# Patient Record
Sex: Male | Born: 1985 | Race: White | Hispanic: No | Marital: Married | Smoking: Former smoker
Health system: Southern US, Community
[De-identification: ages and names within clinical notes are randomized; demographics above are authoritative.]

## PROBLEM LIST (undated history)

## (undated) DIAGNOSIS — S62109A Fracture of unspecified carpal bone, unspecified wrist, initial encounter for closed fracture: Secondary | ICD-10-CM

## (undated) DIAGNOSIS — T7840XA Allergy, unspecified, initial encounter: Secondary | ICD-10-CM

## (undated) DIAGNOSIS — S42009A Fracture of unspecified part of unspecified clavicle, initial encounter for closed fracture: Secondary | ICD-10-CM

## (undated) DIAGNOSIS — K219 Gastro-esophageal reflux disease without esophagitis: Secondary | ICD-10-CM

## (undated) HISTORY — PX: TONSILLECTOMY: SUR1361

## (undated) HISTORY — DX: Allergy, unspecified, initial encounter: T78.40XA

## (undated) HISTORY — PX: FRACTURE SURGERY: SHX138

---
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
## 2008-05-18 ENCOUNTER — Emergency Department: Payer: Self-pay | Admitting: Emergency Medicine

## 2010-03-08 ENCOUNTER — Emergency Department (HOSPITAL_COMMUNITY): Admission: EM | Admit: 2010-03-08 | Discharge: 2010-03-08 | Payer: Self-pay | Admitting: Emergency Medicine

## 2012-10-16 ENCOUNTER — Emergency Department: Payer: Self-pay | Admitting: Internal Medicine

## 2013-07-29 ENCOUNTER — Emergency Department (HOSPITAL_COMMUNITY)
Admission: EM | Admit: 2013-07-29 | Discharge: 2013-07-30 | Disposition: A | Discharge status: Home / Self Care | Payer: 59 | Attending: Emergency Medicine | Admitting: Emergency Medicine

## 2013-07-29 ENCOUNTER — Encounter (HOSPITAL_COMMUNITY): Payer: Self-pay | Admitting: Emergency Medicine

## 2013-07-29 ENCOUNTER — Emergency Department (HOSPITAL_COMMUNITY): Payer: 59

## 2013-07-29 DIAGNOSIS — S0083XA Contusion of other part of head, initial encounter: Secondary | ICD-10-CM

## 2013-07-29 DIAGNOSIS — S0003XA Contusion of scalp, initial encounter: Secondary | ICD-10-CM | POA: Insufficient documentation

## 2013-07-29 DIAGNOSIS — Z880 Allergy status to penicillin: Secondary | ICD-10-CM | POA: Insufficient documentation

## 2013-07-29 DIAGNOSIS — S1093XA Contusion of unspecified part of neck, initial encounter: Secondary | ICD-10-CM

## 2013-07-29 DIAGNOSIS — F17200 Nicotine dependence, unspecified, uncomplicated: Secondary | ICD-10-CM | POA: Insufficient documentation

## 2013-07-29 DIAGNOSIS — S42009A Fracture of unspecified part of unspecified clavicle, initial encounter for closed fracture: Secondary | ICD-10-CM | POA: Insufficient documentation

## 2013-07-29 DIAGNOSIS — IMO0002 Reserved for concepts with insufficient information to code with codable children: Secondary | ICD-10-CM | POA: Insufficient documentation

## 2013-07-29 DIAGNOSIS — S42001A Fracture of unspecified part of right clavicle, initial encounter for closed fracture: Secondary | ICD-10-CM

## 2013-07-29 DIAGNOSIS — T07XXXA Unspecified multiple injuries, initial encounter: Secondary | ICD-10-CM

## 2013-07-29 DIAGNOSIS — Y9389 Activity, other specified: Secondary | ICD-10-CM | POA: Insufficient documentation

## 2013-07-29 MED ORDER — OXYCODONE-ACETAMINOPHEN 5-325 MG PO TABS
1.0000 | ORAL_TABLET | Freq: Once | ORAL | Status: AC
  Administered 2013-07-29: 1 via ORAL
  Filled 2013-07-29: qty 1
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
## 2013-07-29 NOTE — ED Provider Notes (Addendum)
MSE was initiated and I personally evaluated the patient and placed orders (if any) at  10:35 PM on July 29, 2013.  The patient appears stable so that the remainder of the MSE may be completed by another provider.  Pt awake/alert, GCS 15.  Reports pain in right shoulder/clavicle.  He is able to move fingers on right hand and distal pulses are equal/intact.  Denies abd pain Imaging ordered   Joya Gaskinsonald W Welborn Keena, MD 07/29/13 2235  Joya Gaskinsonald W Rebbecca Osuna, MD 07/29/13 2236

## 2013-07-30 MED ORDER — OXYCODONE-ACETAMINOPHEN 5-325 MG PO TABS: 2.0000 | ORAL_TABLET | ORAL | Status: DC | PRN

## 2013-07-30 MED ORDER — IBUPROFEN 800 MG PO TABS: 800.0000 mg | ORAL_TABLET | Freq: Three times a day (TID) | ORAL | Status: DC

## 2013-07-30 NOTE — Discharge Instructions (Signed)
Clavicle Fracture °A clavicle fracture is a break in the collarbone. This is a common injury, especially in children. Collarbones do not harden until around the age of 20. Most collarbone fractures are treated with a simple arm sling. In some cases a figure-of-eight splint is used to help hold the broken bones in position. Although not often needed, surgery may be required if the bone fragments are not in the correct position (displaced).  °HOME CARE INSTRUCTIONS  °· Apply ice to the injury for 15-20 minutes each hour while awake for 2 days. Put the ice in a plastic bag and place a towel between the bag of ice and your skin. °· Wear the sling or splint constantly for as long as directed by your caregiver. You may remove the sling or splint for bathing or showering. Be sure to keep your shoulder in the same place as when the sling or splint is on. Do not lift your arm. °· If a figure-of-eight splint is applied, it must be tightened by another person every day. Tighten it enough to keep the shoulders held back. Allow enough room to place the index finger between the body and strap. Loosen the splint immediately if you feel numbness or tingling in your hands. °· Only take over-the-counter or prescription medicines for pain, discomfort, or fever as directed by your caregiver. °· Avoid activities that irritate or increase the pain for 4 to 6 weeks after surgery. °· Follow all instructions for follow-up with your caregiver. This includes any referrals, physical therapy, and rehabilitation. Any delay in obtaining necessary care could result in a delay or failure of the injury to heal properly. °SEEK MEDICAL CARE IF:  °You have pain and swelling that are not relieved with medications. °SEEK IMMEDIATE MEDICAL CARE IF:  °Your arm is numb, cold, or pale, even when the splint is loose. °MAKE SURE YOU:  °· Understand these instructions. °· Will watch your condition. °· Will get help right away if you are not doing well or get  worse. °Document Released: 01/21/2005 Document Revised: 07/06/2011 Document Reviewed: 11/17/2007 °[employer]® Patient Information ©2014 [employer]. ° °

## 2013-07-31 ENCOUNTER — Telehealth: Payer: Self-pay | Admitting: Orthopedic Surgery
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
## 2015-06-09 ENCOUNTER — Emergency Department (HOSPITAL_COMMUNITY): Payer: Self-pay

## 2015-06-09 ENCOUNTER — Emergency Department (HOSPITAL_COMMUNITY)
Admission: EM | Admit: 2015-06-09 | Discharge: 2015-06-09 | Disposition: A | Discharge status: Home / Self Care | Payer: Self-pay | Attending: Emergency Medicine | Admitting: Emergency Medicine

## 2015-06-09 ENCOUNTER — Encounter (HOSPITAL_COMMUNITY): Payer: Self-pay | Admitting: Emergency Medicine

## 2015-06-09 DIAGNOSIS — Y998 Other external cause status: Secondary | ICD-10-CM | POA: Insufficient documentation

## 2015-06-09 DIAGNOSIS — F17200 Nicotine dependence, unspecified, uncomplicated: Secondary | ICD-10-CM | POA: Insufficient documentation

## 2015-06-09 DIAGNOSIS — Y92410 Unspecified street and highway as the place of occurrence of the external cause: Secondary | ICD-10-CM | POA: Insufficient documentation

## 2015-06-09 DIAGNOSIS — Y9355 Activity, bike riding: Secondary | ICD-10-CM | POA: Insufficient documentation

## 2015-06-09 DIAGNOSIS — Z8781 Personal history of (healed) traumatic fracture: Secondary | ICD-10-CM | POA: Insufficient documentation

## 2015-06-09 DIAGNOSIS — S0081XA Abrasion of other part of head, initial encounter: Secondary | ICD-10-CM | POA: Insufficient documentation

## 2015-06-09 DIAGNOSIS — S060X1A Concussion with loss of consciousness of 30 minutes or less, initial encounter: Secondary | ICD-10-CM | POA: Insufficient documentation

## 2015-06-09 DIAGNOSIS — Z880 Allergy status to penicillin: Secondary | ICD-10-CM | POA: Insufficient documentation

## 2015-06-09 HISTORY — DX: Fracture of unspecified part of unspecified clavicle, initial encounter for closed fracture: S42.009A

## 2015-06-09 HISTORY — DX: Fracture of unspecified carpal bone, unspecified wrist, initial encounter for closed fracture: S62.109A

## 2015-06-09 LAB — CBC WITH DIFFERENTIAL/PLATELET
Basophils Absolute: 0 10*3/uL (ref 0.0–0.1)
Basophils Relative: 0 %
EOS ABS: 0 10*3/uL (ref 0.0–0.7)
Eosinophils Relative: 1 %
HCT: 46.6 % (ref 39.0–52.0)
HEMOGLOBIN: 16.3 g/dL (ref 13.0–17.0)
LYMPHS ABS: 1.6 10*3/uL (ref 0.7–4.0)
LYMPHS PCT: 28 %
MCH: 30.9 pg (ref 26.0–34.0)
MCHC: 35 g/dL (ref 30.0–36.0)
MCV: 88.4 fL (ref 78.0–100.0)
Monocytes Absolute: 0.5 10*3/uL (ref 0.1–1.0)
Monocytes Relative: 8 %
NEUTROS PCT: 63 %
Neutro Abs: 3.6 10*3/uL (ref 1.7–7.7)
Platelets: 204 10*3/uL (ref 150–400)
RBC: 5.27 MIL/uL (ref 4.22–5.81)
RDW: 12.7 % (ref 11.5–15.5)
WBC: 5.7 10*3/uL (ref 4.0–10.5)

## 2015-06-09 LAB — I-STAT CHEM 8, ED
BUN: 12 mg/dL (ref 6–20)
CALCIUM ION: 1.16 mmol/L (ref 1.12–1.23)
CHLORIDE: 100 mmol/L — AB (ref 101–111)
Creatinine, Ser: 1.2 mg/dL (ref 0.61–1.24)
Glucose, Bld: 106 mg/dL — ABNORMAL HIGH (ref 65–99)
HCT: 52 % (ref 39.0–52.0)
Hemoglobin: 17.7 g/dL — ABNORMAL HIGH (ref 13.0–17.0)
POTASSIUM: 3.4 mmol/L — AB (ref 3.5–5.1)
SODIUM: 139 mmol/L (ref 135–145)
TCO2: 23 mmol/L (ref 0–100)

## 2015-06-09 LAB — COMPREHENSIVE METABOLIC PANEL
ALBUMIN: 4.1 g/dL (ref 3.5–5.0)
ALK PHOS: 56 U/L (ref 38–126)
ALT: 23 U/L (ref 17–63)
AST: 25 U/L (ref 15–41)
Anion gap: 13 (ref 5–15)
BUN: 11 mg/dL (ref 6–20)
CALCIUM: 9.6 mg/dL (ref 8.9–10.3)
CO2: 22 mmol/L (ref 22–32)
CREATININE: 1.21 mg/dL (ref 0.61–1.24)
Chloride: 101 mmol/L (ref 101–111)
GFR calc non Af Amer: >60 mL/min (ref >60)
GLUCOSE: 108 mg/dL — AB (ref 65–99)
Potassium: 3.5 mmol/L (ref 3.5–5.1)
SODIUM: 136 mmol/L (ref 135–145)
Total Bilirubin: 1 mg/dL (ref 0.3–1.2)
Total Protein: 7 g/dL (ref 6.5–8.1)
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
## 2015-06-09 NOTE — Discharge Instructions (Signed)
Concussion, Adult °A concussion, or closed-head injury, is a brain injury caused by a direct blow to the head or by a quick and sudden movement (jolt) of the head or neck. Concussions are usually not life-threatening. Even so, the effects of a concussion can be serious. If you have had a concussion before, you are more likely to experience concussion-like symptoms after a direct blow to the head.  °CAUSES °· Direct blow to the head, such as from running into another player during a soccer game, being hit in a fight, or hitting your head on a hard surface. °· A jolt of the head or neck that causes the brain to move back and forth inside the skull, such as in a car crash. °SIGNS AND SYMPTOMS °The signs of a concussion can be hard to notice. Early on, they may be missed by you, family members, and health care providers. You may look fine but act or feel differently. °Symptoms are usually temporary, but they may last for days, weeks, or even longer. Some symptoms may appear right away while others may not show up for hours or days. Every head injury is different. Symptoms include: °· Mild to moderate headaches that will not go away. °· A feeling of pressure inside your head. °· Having more trouble than usual: °· Learning or remembering things you have heard. °· Answering questions. °· Paying attention or concentrating. °· Organizing daily tasks. °· Making decisions and solving problems. °· Slowness in thinking, acting or reacting, speaking, or reading. °· Getting lost or being easily confused. °· Feeling tired all the time or lacking energy (fatigued). °· Feeling drowsy. °· Sleep disturbances. °· Sleeping more than usual. °· Sleeping less than usual. °· Trouble falling asleep. °· Trouble sleeping (insomnia). °· Loss of balance or feeling lightheaded or dizzy. °· Nausea or vomiting. °· Numbness or tingling. °· Increased sensitivity to: °· Sounds. °· Lights. °· Distractions. °· Vision problems or eyes that tire  easily. °· Diminished sense of taste or smell. °· Ringing in the ears. °· Mood changes such as feeling sad or anxious. °· Becoming easily irritated or angry for little or no reason. °· Lack of motivation. °· Seeing or hearing things other people do not see or hear (hallucinations). °DIAGNOSIS °Your health care provider can usually diagnose a concussion based on a description of your injury and symptoms. He or she will ask whether you passed out (lost consciousness) and whether you are having trouble remembering events that happened right before and during your injury. °Your evaluation might include: °· A brain scan to look for signs of injury to the brain. Even if the test shows no injury, you may still have a concussion. °· Blood tests to be sure other problems are not present. °TREATMENT °· Concussions are usually treated in an emergency department, in urgent care, or at a clinic. You may need to stay in the hospital overnight for further treatment. °· Tell your health care provider if you are taking any medicines, including prescription medicines, over-the-counter medicines, and natural remedies. Some medicines, such as blood thinners (anticoagulants) and aspirin, may increase the chance of complications. Also tell your health care provider whether you have had alcohol or are taking illegal drugs. This information may affect treatment. °· Your health care provider will send you home with important instructions to follow. °· How fast you will recover from a concussion depends on many factors. These factors include how severe your concussion is, what part of your brain was injured,   your age, and how healthy you were before the concussion. °· Most people with mild injuries recover fully. Recovery can take time. In general, recovery is slower in older persons. Also, persons who have had a concussion in the past or have other medical problems may find that it takes longer to recover from their current injury. °HOME  CARE INSTRUCTIONS °General Instructions °· Carefully follow the directions your health care provider gave you. °· Only take over-the-counter or prescription medicines for pain, discomfort, or fever as directed by your health care provider. °· Take only those medicines that your health care provider has approved. °· Do not drink alcohol until your health care provider says you are well enough to do so. Alcohol and certain other drugs may slow your recovery and can put you at risk of further injury. °· If it is harder than usual to remember things, write them down. °· If you are easily distracted, try to do one thing at a time. For example, do not try to watch TV while fixing dinner. °· Talk with family members or close friends when making important decisions. °· Keep all follow-up appointments. Repeated evaluation of your symptoms is recommended for your recovery. °· Watch your symptoms and tell others to do the same. Complications sometimes occur after a concussion. Older adults with a brain injury may have a higher risk of serious complications, such as a blood clot on the brain. °· Tell your teachers, school nurse, school counselor, coach, athletic trainer, or work manager about your injury, symptoms, and restrictions. Tell them about what you can or cannot do. They should watch for: °¨ Increased problems with attention or concentration. °¨ Increased difficulty remembering or learning new information. °¨ Increased time needed to complete tasks or assignments. °¨ Increased irritability or decreased ability to cope with stress. °¨ Increased symptoms. °· Rest. Rest helps the brain to heal. Make sure you: °¨ Get plenty of sleep at night. Avoid staying up late at night. °¨ Keep the same bedtime hours on weekends and weekdays. °¨ Rest during the day. Take daytime naps or rest breaks when you feel tired. °· Limit activities that require a lot of thought or concentration. These include: °¨ Doing homework or job-related  work. °¨ Watching TV. °¨ Working on the computer. °· Avoid any situation where there is potential for another head injury (football, hockey, soccer, basketball, martial arts, downhill snow sports and horseback riding). Your condition will get worse every time you experience a concussion. You should avoid these activities until you are evaluated by the appropriate follow-up health care providers. °Returning To Your Regular Activities °You will need to return to your normal activities slowly, not all at once. You must give your body and brain enough time for recovery. °· Do not return to sports or other athletic activities until your health care provider tells you it is safe to do so. °· Ask your health care provider when you can drive, ride a bicycle, or operate heavy machinery. Your ability to react may be slower after a brain injury. Never do these activities if you are dizzy. °· Ask your health care provider about when you can return to work or school. °Preventing Another Concussion °It is very important to avoid another brain injury, especially before you have recovered. In rare cases, another injury can lead to permanent brain damage, brain swelling, or death. The risk of this is greatest during the first 7-10 days after a head injury. Avoid injuries by: °· Wearing a   seat belt when riding in a car. °· Drinking alcohol only in moderation. °· Wearing a helmet when biking, skiing, skateboarding, skating, or doing similar activities. °· Avoiding activities that could lead to a second concussion, such as contact or recreational sports, until your health care provider says it is okay. °· Taking safety measures in your home. °¨ Remove clutter and tripping hazards from floors and stairways. °¨ Use grab bars in bathrooms and handrails by stairs. °¨ Place non-slip mats on floors and in bathtubs. °¨ Improve lighting in dim areas. °SEEK MEDICAL CARE IF: °· You have increased problems paying attention or  concentrating. °· You have increased difficulty remembering or learning new information. °· You need more time to complete tasks or assignments than before. °· You have increased irritability or decreased ability to cope with stress. °· You have more symptoms than before. °Seek medical care if you have any of the following symptoms for more than 2 weeks after your injury: °· Lasting (chronic) headaches. °· Dizziness or balance problems. °· Nausea. °· Vision problems. °· Increased sensitivity to noise or light. °· Depression or mood swings. °· Anxiety or irritability. °· Memory problems. °· Difficulty concentrating or paying attention. °· Sleep problems. °· Feeling tired all the time. °SEEK IMMEDIATE MEDICAL CARE IF: °· You have severe or worsening headaches. These may be a sign of a blood clot in the brain. °· You have weakness (even if only in one hand, leg, or part of the face). °· You have numbness. °· You have decreased coordination. °· You vomit repeatedly. °· You have increased sleepiness. °· One pupil is larger than the other. °· You have convulsions. °· You have slurred speech. °· You have increased confusion. This may be a sign of a blood clot in the brain. °· You have increased restlessness, agitation, or irritability. °· You are unable to recognize people or places. °· You have neck pain. °· It is difficult to wake you up. °· You have unusual behavior changes. °· You lose consciousness. °MAKE SURE YOU: °· Understand these instructions. °· Will watch your condition. °· Will get help right away if you are not doing well or get worse. °  °This information is not intended to replace advice given to you by your health care provider. Make sure you discuss any questions you have with your health care provider. °  °Document Released: 07/04/2003 Document Revised: 05/04/2014 Document Reviewed: 11/03/2012 °Elsevier Interactive Patient Education ©2016 [employer] ° °Motor Vehicle Collision °It is common to have  multiple bruises and sore muscles after a motor vehicle collision (MVC). These tend to feel worse for the first 24 hours. You may have the most stiffness and soreness over the first several hours. You may also feel worse when you wake up the first morning after your collision. After this point, you will usually begin to improve with each day. The speed of improvement often depends on the severity of the collision, the number of injuries, and the location and nature of these injuries. °HOME CARE INSTRUCTIONS °· Put ice on the injured area. °¨ Put ice in a plastic bag. °¨ Place a towel between your skin and the bag. °¨ Leave the ice on for 15-20 minutes, 3-4 times a day, or as directed by your health care provider. °· Drink enough fluids to keep your urine clear or pale yellow. Do not drink alcohol. °· Take a warm shower or bath once or twice a day. This will increase blood flow to sore   muscles. °· You may return to activities as directed by your caregiver. Be careful when lifting, as this may aggravate neck or back pain. °· Only take over-the-counter or prescription medicines for pain, discomfort, or fever as directed by your caregiver. Do not use aspirin. This may increase bruising and bleeding. °SEEK IMMEDIATE MEDICAL CARE IF: °· You have numbness, tingling, or weakness in the arms or legs. °· You develop severe headaches not relieved with medicine. °· You have severe neck pain, especially tenderness in the middle of the back of your neck. °· You have changes in bowel or bladder control. °· There is increasing pain in any area of the body. °· You have shortness of breath, light-headedness, dizziness, or fainting. °· You have chest pain. °· You feel sick to your stomach (nauseous), throw up (vomit), or sweat. °· You have increasing abdominal discomfort. °· There is blood in your urine, stool, or vomit. °· You have pain in your shoulder (shoulder strap areas). °· You feel your symptoms are getting worse. °MAKE SURE  YOU: °· Understand these instructions. °· Will watch your condition. °· Will get help right away if you are not doing well or get worse. °  °This information is not intended to replace advice given to you by your health care provider. Make sure you discuss any questions you have with your health care provider. °  °Document Released: 04/13/2005 Document Revised: 05/04/2014 Document Reviewed: 09/10/2010 °Elsevier Interactive Patient Education ©2016 [employer] ° °

## 2015-06-09 NOTE — ED Notes (Signed)
Pt stable, ambulatory, states understanding of discharge instructions 

## 2015-06-09 NOTE — ED Notes (Signed)
See trauma note

## 2017-08-08 ENCOUNTER — Emergency Department: Payer: No Typology Code available for payment source

## 2017-08-08 ENCOUNTER — Encounter: Payer: Self-pay | Admitting: Emergency Medicine

## 2017-08-08 ENCOUNTER — Emergency Department
Admission: EM | Admit: 2017-08-08 | Discharge: 2017-08-08 | Disposition: A | Discharge status: Home / Self Care | Payer: No Typology Code available for payment source | Attending: Emergency Medicine | Admitting: Emergency Medicine

## 2017-08-08 DIAGNOSIS — Y9389 Activity, other specified: Secondary | ICD-10-CM | POA: Diagnosis not present

## 2017-08-08 DIAGNOSIS — Y998 Other external cause status: Secondary | ICD-10-CM | POA: Diagnosis not present

## 2017-08-08 DIAGNOSIS — Y92410 Unspecified street and highway as the place of occurrence of the external cause: Secondary | ICD-10-CM | POA: Diagnosis not present

## 2017-08-08 DIAGNOSIS — S63502A Unspecified sprain of left wrist, initial encounter: Secondary | ICD-10-CM | POA: Insufficient documentation

## 2017-08-08 DIAGNOSIS — Z87891 Personal history of nicotine dependence: Secondary | ICD-10-CM | POA: Diagnosis not present

## 2017-08-08 DIAGNOSIS — S6992XA Unspecified injury of left wrist, hand and finger(s), initial encounter: Secondary | ICD-10-CM | POA: Diagnosis present

## 2017-08-08 MED ORDER — MELOXICAM 15 MG PO TABS: 15.0000 mg | ORAL_TABLET | Freq: Every day | ORAL | 0 refills | Status: DC

## 2017-08-08 MED ORDER — TRAMADOL HCL 50 MG PO TABS: 50.0000 mg | ORAL_TABLET | Freq: Four times a day (QID) | ORAL | 0 refills | Status: DC | PRN
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
## 2017-08-08 NOTE — ED Notes (Signed)

## 2019-10-25 ENCOUNTER — Ambulatory Visit: Payer: Self-pay

## 2019-10-25 ENCOUNTER — Other Ambulatory Visit: Payer: Self-pay

## 2019-10-25 ENCOUNTER — Emergency Department
Admission: EM | Admit: 2019-10-25 | Discharge: 2019-10-25 | Disposition: A | Discharge status: Home / Self Care | Payer: BC Managed Care – PPO | Attending: Emergency Medicine | Admitting: Emergency Medicine

## 2019-10-25 ENCOUNTER — Encounter: Payer: Self-pay | Admitting: Emergency Medicine

## 2019-10-25 ENCOUNTER — Ambulatory Visit: Admission: RE | Admit: 2019-10-25 | Payer: No Typology Code available for payment source | Source: Ambulatory Visit

## 2019-10-25 DIAGNOSIS — R0602 Shortness of breath: Secondary | ICD-10-CM | POA: Diagnosis present

## 2019-10-25 DIAGNOSIS — Z5321 Procedure and treatment not carried out due to patient leaving prior to being seen by health care provider: Secondary | ICD-10-CM | POA: Diagnosis not present

## 2019-10-25 NOTE — ED Notes (Signed)
Pt to desk, got a call that his son is sick at daycare and needs to be picked up. Pt has no one else to pick him up and has to leave. Encouraged to return if can. Alert and oriented.

## 2019-10-25 NOTE — ED Triage Notes (Signed)
Pt here for [facility]. Went to [facility] and passed out when they were about to draw his blood.  Passes out when thinking about blood going into tubes. Would like to wait and not do blood work at this time. Started feeling bad Friday. No cough at this time, unless takes large breath.  Minimal low back pain.denies fevers.  Started with general body aches.  Tachypnea noted but unlabored at this time. Reports sats were 89 at home, currently 95% RA

## 2021-01-12 ENCOUNTER — Encounter (HOSPITAL_COMMUNITY): Payer: Self-pay | Admitting: Emergency Medicine

## 2021-01-12 ENCOUNTER — Emergency Department (HOSPITAL_COMMUNITY): Payer: BC Managed Care – PPO

## 2021-01-12 ENCOUNTER — Emergency Department (HOSPITAL_COMMUNITY)
Admission: EM | Admit: 2021-01-12 | Discharge: 2021-01-13 | Disposition: A | Discharge status: Home / Self Care | Payer: BC Managed Care – PPO | Attending: Emergency Medicine | Admitting: Emergency Medicine

## 2021-01-12 ENCOUNTER — Other Ambulatory Visit: Payer: Self-pay

## 2021-01-12 DIAGNOSIS — S81811A Laceration without foreign body, right lower leg, initial encounter: Secondary | ICD-10-CM | POA: Insufficient documentation

## 2021-01-12 DIAGNOSIS — Z87891 Personal history of nicotine dependence: Secondary | ICD-10-CM | POA: Diagnosis not present

## 2021-01-12 DIAGNOSIS — Y9389 Activity, other specified: Secondary | ICD-10-CM | POA: Diagnosis not present

## 2021-01-12 DIAGNOSIS — W230XXA Caught, crushed, jammed, or pinched between moving objects, initial encounter: Secondary | ICD-10-CM | POA: Diagnosis not present

## 2021-01-12 DIAGNOSIS — S8991XA Unspecified injury of right lower leg, initial encounter: Secondary | ICD-10-CM | POA: Diagnosis present

## 2021-01-12 MED ORDER — CEFAZOLIN SODIUM 1 G IJ SOLR
1.0000 g | Freq: Once | INTRAMUSCULAR | Status: AC
  Administered 2021-01-12: 1 g via INTRAMUSCULAR
  Filled 2021-01-12: qty 10

## 2021-01-12 MED ORDER — LIDOCAINE-EPINEPHRINE (PF) 2 %-1:200000 IJ SOLN
20.0000 mL | Freq: Once | INTRAMUSCULAR | Status: AC
  Administered 2021-01-12: 20 mL

## 2021-01-12 MED ORDER — HYDROCODONE-ACETAMINOPHEN 5-325 MG PO TABS
1.0000 | ORAL_TABLET | Freq: Once | ORAL | Status: AC
  Administered 2021-01-12: 1 via ORAL
  Filled 2021-01-12: qty 1

## 2021-01-12 MED ORDER — POVIDONE-IODINE 5 % EX SOLN
Freq: Once | CUTANEOUS | Status: AC
  Filled 2021-01-12: qty 88.7
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
## 2021-01-12 NOTE — ED Notes (Signed)
Tetanus shot 3 years ago
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
## 2021-01-13 MED ORDER — CEPHALEXIN 500 MG PO CAPS: 500.0000 mg | ORAL_CAPSULE | Freq: Four times a day (QID) | ORAL | 0 refills | Status: DC

## 2021-01-13 NOTE — Discharge Instructions (Signed)
Keep wound clean and dry.  Follow-up with Dr. Eulah Pont.  As we discussed there could be some damage to the muscle and/or tendons or nerves.  Wear the boot and can use the crutches.  Take the antibiotics as prescribed.  Sutures should be removed in 7 to 10 days by Dr. Eulah Pont.  Return to the ED with new or worsening symptoms.

## 2021-09-03 ENCOUNTER — Other Ambulatory Visit: Payer: Self-pay

## 2021-09-03 ENCOUNTER — Emergency Department (HOSPITAL_COMMUNITY): Payer: BC Managed Care – PPO

## 2021-09-03 ENCOUNTER — Encounter (HOSPITAL_COMMUNITY): Payer: Self-pay | Admitting: Emergency Medicine

## 2021-09-03 ENCOUNTER — Inpatient Hospital Stay (HOSPITAL_COMMUNITY)
Admission: EM | Admit: 2021-09-03 | Discharge: 2021-09-05 | DRG: 493 | Disposition: A | Discharge status: Home / Self Care | Payer: BC Managed Care – PPO | Attending: Orthopedic Surgery | Admitting: Orthopedic Surgery

## 2021-09-03 DIAGNOSIS — S82401A Unspecified fracture of shaft of right fibula, initial encounter for closed fracture: Secondary | ICD-10-CM | POA: Diagnosis present

## 2021-09-03 DIAGNOSIS — D62 Acute posthemorrhagic anemia: Secondary | ICD-10-CM | POA: Diagnosis not present

## 2021-09-03 DIAGNOSIS — S82201A Unspecified fracture of shaft of right tibia, initial encounter for closed fracture: Secondary | ICD-10-CM | POA: Diagnosis not present

## 2021-09-03 DIAGNOSIS — Z20822 Contact with and (suspected) exposure to covid-19: Secondary | ICD-10-CM | POA: Diagnosis not present

## 2021-09-03 DIAGNOSIS — S82831A Other fracture of upper and lower end of right fibula, initial encounter for closed fracture: Secondary | ICD-10-CM | POA: Diagnosis not present

## 2021-09-03 DIAGNOSIS — M25561 Pain in right knee: Secondary | ICD-10-CM | POA: Diagnosis not present

## 2021-09-03 DIAGNOSIS — Z87891 Personal history of nicotine dependence: Secondary | ICD-10-CM | POA: Diagnosis not present

## 2021-09-03 DIAGNOSIS — S79921A Unspecified injury of right thigh, initial encounter: Secondary | ICD-10-CM | POA: Diagnosis not present

## 2021-09-03 DIAGNOSIS — Z22322 Carrier or suspected carrier of Methicillin resistant Staphylococcus aureus: Secondary | ICD-10-CM | POA: Diagnosis not present

## 2021-09-03 DIAGNOSIS — Z79899 Other long term (current) drug therapy: Secondary | ICD-10-CM | POA: Diagnosis not present

## 2021-09-03 DIAGNOSIS — Z881 Allergy status to other antibiotic agents status: Secondary | ICD-10-CM

## 2021-09-03 DIAGNOSIS — R52 Pain, unspecified: Secondary | ICD-10-CM | POA: Diagnosis not present

## 2021-09-03 DIAGNOSIS — S82461A Displaced segmental fracture of shaft of right fibula, initial encounter for closed fracture: Secondary | ICD-10-CM | POA: Diagnosis not present

## 2021-09-03 DIAGNOSIS — K219 Gastro-esophageal reflux disease without esophagitis: Secondary | ICD-10-CM | POA: Diagnosis present

## 2021-09-03 DIAGNOSIS — Z7901 Long term (current) use of anticoagulants: Secondary | ICD-10-CM

## 2021-09-03 DIAGNOSIS — S99921A Unspecified injury of right foot, initial encounter: Secondary | ICD-10-CM | POA: Diagnosis not present

## 2021-09-03 DIAGNOSIS — S82251A Displaced comminuted fracture of shaft of right tibia, initial encounter for closed fracture: Secondary | ICD-10-CM | POA: Diagnosis not present

## 2021-09-03 DIAGNOSIS — S82451A Displaced comminuted fracture of shaft of right fibula, initial encounter for closed fracture: Secondary | ICD-10-CM | POA: Diagnosis not present

## 2021-09-03 DIAGNOSIS — Z041 Encounter for examination and observation following transport accident: Secondary | ICD-10-CM | POA: Diagnosis not present

## 2021-09-03 DIAGNOSIS — S82871A Displaced pilon fracture of right tibia, initial encounter for closed fracture: Principal | ICD-10-CM | POA: Diagnosis present

## 2021-09-03 DIAGNOSIS — M25571 Pain in right ankle and joints of right foot: Secondary | ICD-10-CM | POA: Diagnosis not present

## 2021-09-03 DIAGNOSIS — I10 Essential (primary) hypertension: Secondary | ICD-10-CM | POA: Diagnosis not present

## 2021-09-03 DIAGNOSIS — Z9889 Other specified postprocedural states: Secondary | ICD-10-CM | POA: Diagnosis not present

## 2021-09-03 HISTORY — DX: Gastro-esophageal reflux disease without esophagitis: K21.9

## 2021-09-03 LAB — CBC
HCT: 44.1 % (ref 39.0–52.0)
Hemoglobin: 15.4 g/dL (ref 13.0–17.0)
MCH: 32.1 pg (ref 26.0–34.0)
MCHC: 34.9 g/dL (ref 30.0–36.0)
MCV: 91.9 fL (ref 80.0–100.0)
Platelets: 247 10*3/uL (ref 150–400)
RBC: 4.8 MIL/uL (ref 4.22–5.81)
RDW: 12.4 % (ref 11.5–15.5)
WBC: 10.4 10*3/uL (ref 4.0–10.5)
nRBC: 0 % (ref 0.0–0.2)

## 2021-09-03 LAB — COMPREHENSIVE METABOLIC PANEL
ALT: 43 U/L (ref 0–44)
AST: 28 U/L (ref 15–41)
Albumin: 3.9 g/dL (ref 3.5–5.0)
Alkaline Phosphatase: 57 U/L (ref 38–126)
Anion gap: 10 (ref 5–15)
BUN: 12 mg/dL (ref 6–20)
CO2: 23 mmol/L (ref 22–32)
Calcium: 9.4 mg/dL (ref 8.9–10.3)
Chloride: 104 mmol/L (ref 98–111)
Creatinine, Ser: 1.08 mg/dL (ref 0.61–1.24)
GFR, Estimated: >60 mL/min (ref >60)
Glucose, Bld: 107 mg/dL — ABNORMAL HIGH (ref 70–99)
Potassium: 3.5 mmol/L (ref 3.5–5.1)
Sodium: 137 mmol/L (ref 135–145)
Total Bilirubin: 0.7 mg/dL (ref 0.3–1.2)
Total Protein: 6.9 g/dL (ref 6.5–8.1)

## 2021-09-03 LAB — I-STAT VENOUS BLOOD GAS, ED
Acid-base deficit: 2 mmol/L (ref 0.0–2.0)
Bicarbonate: 22.9 mmol/L (ref 20.0–28.0)
Calcium, Ion: 1.12 mmol/L — ABNORMAL LOW (ref 1.15–1.40)
HCT: 45 % (ref 39.0–52.0)
Hemoglobin: 15.3 g/dL (ref 13.0–17.0)
O2 Saturation: 91 %
Potassium: 3.5 mmol/L (ref 3.5–5.1)
Sodium: 137 mmol/L (ref 135–145)
TCO2: 24 mmol/L (ref 22–32)
pCO2, Ven: 37.1 mmHg — ABNORMAL LOW (ref 44–60)
pH, Ven: 7.398 (ref 7.25–7.43)
pO2, Ven: 61 mmHg — ABNORMAL HIGH (ref 32–45)

## 2021-09-03 LAB — SAMPLE TO BLOOD BANK

## 2021-09-03 LAB — PROTIME-INR
INR: 0.9 (ref 0.8–1.2)
Prothrombin Time: 12.1 seconds (ref 11.4–15.2)

## 2021-09-03 LAB — LACTIC ACID, PLASMA: Lactic Acid, Venous: 2.8 mmol/L (ref 0.5–1.9)

## 2021-09-03 LAB — ETHANOL: Alcohol, Ethyl (B): <10 mg/dL (ref <10)

## 2021-09-03 MED ORDER — ACETAMINOPHEN 500 MG PO TABS
1000.0000 mg | ORAL_TABLET | Freq: Four times a day (QID) | ORAL | Status: DC
  Administered 2021-09-03 – 2021-09-04 (×2): 1000 mg via ORAL
  Filled 2021-09-03 (×2): qty 2

## 2021-09-03 MED ORDER — OXYCODONE HCL 5 MG PO TABS
10.0000 mg | ORAL_TABLET | ORAL | Status: DC | PRN
  Administered 2021-09-04: 10 mg via ORAL

## 2021-09-03 MED ORDER — METHOCARBAMOL 1000 MG/10ML IJ SOLN: 500.0000 mg | Freq: Four times a day (QID) | INTRAVENOUS | Status: DC | PRN

## 2021-09-03 MED ORDER — ENOXAPARIN SODIUM 40 MG/0.4ML IJ SOSY: 40.0000 mg | PREFILLED_SYRINGE | INTRAMUSCULAR | Status: DC

## 2021-09-03 MED ORDER — HYDROMORPHONE HCL 1 MG/ML IJ SOLN
INTRAMUSCULAR | Status: AC
  Administered 2021-09-03: 0.5 mg via INTRAVENOUS
  Filled 2021-09-03: qty 1

## 2021-09-03 MED ORDER — HYDROMORPHONE HCL 1 MG/ML IJ SOLN
1.0000 mg | Freq: Once | INTRAMUSCULAR | Status: AC
  Administered 2021-09-04: 1 mg via INTRAVENOUS

## 2021-09-03 MED ORDER — ONDANSETRON HCL 4 MG PO TABS: 4.0000 mg | ORAL_TABLET | Freq: Four times a day (QID) | ORAL | Status: DC | PRN

## 2021-09-03 MED ORDER — METHOCARBAMOL 500 MG PO TABS
500.0000 mg | ORAL_TABLET | Freq: Four times a day (QID) | ORAL | Status: DC | PRN
  Administered 2021-09-03: 500 mg via ORAL
  Filled 2021-09-03: qty 1

## 2021-09-03 MED ORDER — HYDROMORPHONE HCL 1 MG/ML IJ SOLN
0.5000 mg | INTRAMUSCULAR | Status: DC | PRN
  Administered 2021-09-04 (×3): 1 mg via INTRAVENOUS
  Filled 2021-09-03 (×3): qty 1

## 2021-09-03 MED ORDER — HYDROMORPHONE HCL 1 MG/ML IJ SOLN: 0.5000 mg | Freq: Once | INTRAMUSCULAR | Status: AC

## 2021-09-03 MED ORDER — ONDANSETRON HCL 4 MG/2ML IJ SOLN: 4.0000 mg | Freq: Four times a day (QID) | INTRAMUSCULAR | Status: DC | PRN

## 2021-09-03 MED ORDER — ACETAMINOPHEN 325 MG PO TABS: 325.0000 mg | ORAL_TABLET | Freq: Four times a day (QID) | ORAL | Status: DC | PRN

## 2021-09-03 MED ORDER — SODIUM CHLORIDE 0.9 % IV BOLUS
1000.0000 mL | Freq: Once | INTRAVENOUS | Status: AC
  Administered 2021-09-03: 1000 mL via INTRAVENOUS

## 2021-09-03 MED ORDER — OXYCODONE HCL 5 MG PO TABS
5.0000 mg | ORAL_TABLET | ORAL | Status: DC | PRN
  Administered 2021-09-03: 10 mg via ORAL
  Filled 2021-09-03 (×2): qty 2

## 2021-09-03 NOTE — Progress Notes (Signed)
Orthopedic Tech Progress Note ?Patient Details:  ?Calvin Simpson ?January 23, 1986 ?[id number] ? ?Patient ID: Calvin Simpson, male   DOB: 09/09/1985, 36 y.o.   MRN: [id number] ?Level II; not needed at the moment. ? ?[place] ?09/03/2021, 10:37 PM ? ?

## 2021-09-03 NOTE — Progress Notes (Signed)
Patient discussed with EDP and films reviewed.  Right distal 1/3 tibia and fibula shaft fractures.  Closed injury.  Per EDP report compartments soft and compressible, no clinical concern for compartment syndrome at this time.  Neurovascularly intact per EDP examination.   ? ?Recommendations: ?- AO splint (posterior slab + stirrup) for immobilization ?- Elevated higher than heart ?- Admit in anticipation of operative fixation, likely 09/04/2021 pending availability ?- Continue to monitor pain closely and neurovascular checks regularly ? ?Full note to follow. ? ?Georgeanna Harrison M.D. ?Orthopaedic Surgery ?[facility] ? ?
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
## 2021-09-03 NOTE — ED Notes (Signed)
Wife at bedside.
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
## 2021-09-03 NOTE — ED Notes (Signed)
Ortho tech called 
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
## 2021-09-04 ENCOUNTER — Other Ambulatory Visit: Payer: Self-pay

## 2021-09-04 ENCOUNTER — Encounter (HOSPITAL_COMMUNITY)
Admission: EM | Disposition: A | Discharge status: Home / Self Care | Payer: Self-pay | Source: Home / Self Care | Attending: Orthopedic Surgery

## 2021-09-04 ENCOUNTER — Inpatient Hospital Stay (HOSPITAL_COMMUNITY): Payer: BC Managed Care – PPO

## 2021-09-04 ENCOUNTER — Inpatient Hospital Stay (HOSPITAL_COMMUNITY): Payer: BC Managed Care – PPO | Admitting: Certified Registered Nurse Anesthetist

## 2021-09-04 ENCOUNTER — Encounter (HOSPITAL_COMMUNITY): Payer: Self-pay | Admitting: Orthopedic Surgery

## 2021-09-04 DIAGNOSIS — S82201A Unspecified fracture of shaft of right tibia, initial encounter for closed fracture: Secondary | ICD-10-CM | POA: Diagnosis present

## 2021-09-04 DIAGNOSIS — Z9889 Other specified postprocedural states: Secondary | ICD-10-CM | POA: Diagnosis not present

## 2021-09-04 DIAGNOSIS — S82451A Displaced comminuted fracture of shaft of right fibula, initial encounter for closed fracture: Secondary | ICD-10-CM | POA: Diagnosis not present

## 2021-09-04 HISTORY — PX: TIBIA IM NAIL INSERTION: SHX2516

## 2021-09-04 LAB — URINALYSIS, ROUTINE W REFLEX MICROSCOPIC
Bilirubin Urine: NEGATIVE
Glucose, UA: NEGATIVE mg/dL
Ketones, ur: NEGATIVE mg/dL
Leukocytes,Ua: NEGATIVE
Nitrite: NEGATIVE
Protein, ur: NEGATIVE mg/dL
Specific Gravity, Urine: 1.019 (ref 1.005–1.030)
pH: 7 (ref 5.0–8.0)

## 2021-09-04 LAB — SURGICAL PCR SCREEN
MRSA, PCR: NEGATIVE
Staphylococcus aureus: POSITIVE — AB

## 2021-09-04 LAB — RESP PANEL BY RT-PCR (FLU A&B, COVID) ARPGX2
Influenza A by PCR: NEGATIVE
Influenza B by PCR: NEGATIVE
SARS Coronavirus 2 by RT PCR: NEGATIVE

## 2021-09-04 LAB — HIV ANTIBODY (ROUTINE TESTING W REFLEX): HIV Screen 4th Generation wRfx: NONREACTIVE

## 2021-09-04 SURGERY — INSERTION, INTRAMEDULLARY ROD, TIBIA
Anesthesia: General | Laterality: Right

## 2021-09-04 MED ORDER — LIDOCAINE 2% (20 MG/ML) 5 ML SYRINGE
INTRAMUSCULAR | Status: AC
  Filled 2021-09-04: qty 5

## 2021-09-04 MED ORDER — CEFAZOLIN SODIUM-DEXTROSE 2-4 GM/100ML-% IV SOLN
INTRAVENOUS | Status: AC
  Filled 2021-09-04: qty 100

## 2021-09-04 MED ORDER — ONDANSETRON HCL 4 MG/2ML IJ SOLN: 4.0000 mg | Freq: Once | INTRAMUSCULAR | Status: DC | PRN

## 2021-09-04 MED ORDER — METHOCARBAMOL 1000 MG/10ML IJ SOLN
500.0000 mg | Freq: Three times a day (TID) | INTRAVENOUS | Status: DC
  Filled 2021-09-04: qty 5

## 2021-09-04 MED ORDER — DEXAMETHASONE SODIUM PHOSPHATE 10 MG/ML IJ SOLN
INTRAMUSCULAR | Status: DC | PRN
  Administered 2021-09-04: 10 mg via INTRAVENOUS

## 2021-09-04 MED ORDER — OXYCODONE HCL 5 MG PO TABS: 5.0000 mg | ORAL_TABLET | Freq: Once | ORAL | Status: DC | PRN

## 2021-09-04 MED ORDER — DOCUSATE SODIUM 100 MG PO CAPS
100.0000 mg | ORAL_CAPSULE | Freq: Two times a day (BID) | ORAL | Status: DC
  Administered 2021-09-04: 100 mg via ORAL
  Filled 2021-09-04: qty 1

## 2021-09-04 MED ORDER — HYDROMORPHONE HCL 1 MG/ML IJ SOLN
INTRAMUSCULAR | Status: AC
  Filled 2021-09-04: qty 1

## 2021-09-04 MED ORDER — 0.9 % SODIUM CHLORIDE (POUR BTL) OPTIME
TOPICAL | Status: DC | PRN
  Administered 2021-09-04: 1000 mL

## 2021-09-04 MED ORDER — ACETAMINOPHEN 10 MG/ML IV SOLN
INTRAVENOUS | Status: AC
  Filled 2021-09-04: qty 100

## 2021-09-04 MED ORDER — PANTOPRAZOLE SODIUM 40 MG PO TBEC
40.0000 mg | DELAYED_RELEASE_TABLET | Freq: Every day | ORAL | Status: DC
  Administered 2021-09-04: 40 mg via ORAL
  Filled 2021-09-04: qty 1

## 2021-09-04 MED ORDER — OMEPRAZOLE MAGNESIUM 20 MG PO TBEC: 20.0000 mg | DELAYED_RELEASE_TABLET | Freq: Every day | ORAL | Status: DC

## 2021-09-04 MED ORDER — ACETAMINOPHEN 500 MG PO TABS
1000.0000 mg | ORAL_TABLET | Freq: Four times a day (QID) | ORAL | Status: DC
Start: 2021-09-05 — End: 2021-09-05
  Administered 2021-09-05: 1000 mg via ORAL
  Filled 2021-09-04: qty 2

## 2021-09-04 MED ORDER — ORAL CARE MOUTH RINSE: 15.0000 mL | Freq: Once | OROMUCOSAL | Status: AC

## 2021-09-04 MED ORDER — HYDROMORPHONE HCL 1 MG/ML IJ SOLN
0.2500 mg | INTRAMUSCULAR | Status: DC | PRN
  Administered 2021-09-04: 0.5 mg via INTRAVENOUS
  Administered 2021-09-04 (×2): 0.25 mg via INTRAVENOUS
  Administered 2021-09-04 (×2): 0.5 mg via INTRAVENOUS

## 2021-09-04 MED ORDER — ONDANSETRON HCL 4 MG/2ML IJ SOLN
INTRAMUSCULAR | Status: AC
  Filled 2021-09-04: qty 2

## 2021-09-04 MED ORDER — ORAL CARE MOUTH RINSE: 15.0000 mL | Freq: Once | OROMUCOSAL | Status: DC

## 2021-09-04 MED ORDER — PROPOFOL 10 MG/ML IV BOLUS
INTRAVENOUS | Status: DC | PRN
  Administered 2021-09-04: 150 mg via INTRAVENOUS

## 2021-09-04 MED ORDER — PHENYLEPHRINE 80 MCG/ML (10ML) SYRINGE FOR IV PUSH (FOR BLOOD PRESSURE SUPPORT)
PREFILLED_SYRINGE | INTRAVENOUS | Status: AC
  Filled 2021-09-04: qty 10

## 2021-09-04 MED ORDER — CEFAZOLIN SODIUM-DEXTROSE 2-4 GM/100ML-% IV SOLN
2.0000 g | Freq: Once | INTRAVENOUS | Status: AC
  Administered 2021-09-04: 2 g via INTRAVENOUS

## 2021-09-04 MED ORDER — KETOROLAC TROMETHAMINE 30 MG/ML IJ SOLN
INTRAMUSCULAR | Status: AC
  Filled 2021-09-04: qty 1

## 2021-09-04 MED ORDER — CEFAZOLIN SODIUM-DEXTROSE 1-4 GM/50ML-% IV SOLN
1.0000 g | Freq: Four times a day (QID) | INTRAVENOUS | Status: DC
  Administered 2021-09-04 – 2021-09-05 (×2): 1 g via INTRAVENOUS
  Filled 2021-09-04 (×2): qty 50

## 2021-09-04 MED ORDER — LACTATED RINGERS IV SOLN: INTRAVENOUS | Status: DC

## 2021-09-04 MED ORDER — LIDOCAINE 2% (20 MG/ML) 5 ML SYRINGE
INTRAMUSCULAR | Status: DC | PRN
  Administered 2021-09-04: 60 mg via INTRAVENOUS

## 2021-09-04 MED ORDER — SUGAMMADEX SODIUM 200 MG/2ML IV SOLN
INTRAVENOUS | Status: DC | PRN
  Administered 2021-09-04: 200 mg via INTRAVENOUS

## 2021-09-04 MED ORDER — MIDAZOLAM HCL 2 MG/2ML IJ SOLN
INTRAMUSCULAR | Status: AC
  Filled 2021-09-04: qty 2

## 2021-09-04 MED ORDER — HYDROMORPHONE HCL 1 MG/ML IJ SOLN: 0.5000 mg | INTRAMUSCULAR | Status: DC | PRN

## 2021-09-04 MED ORDER — ROCURONIUM BROMIDE 10 MG/ML (PF) SYRINGE
PREFILLED_SYRINGE | INTRAVENOUS | Status: DC | PRN
  Administered 2021-09-04: 20 mg via INTRAVENOUS
  Administered 2021-09-04: 10 mg via INTRAVENOUS
  Administered 2021-09-04: 50 mg via INTRAVENOUS
  Administered 2021-09-04: 20 mg via INTRAVENOUS

## 2021-09-04 MED ORDER — KETOROLAC TROMETHAMINE 15 MG/ML IJ SOLN
15.0000 mg | Freq: Four times a day (QID) | INTRAMUSCULAR | Status: DC
Start: 2021-09-05 — End: 2021-09-05
  Administered 2021-09-04 – 2021-09-05 (×2): 15 mg via INTRAVENOUS
  Filled 2021-09-04 (×2): qty 1

## 2021-09-04 MED ORDER — HYDROMORPHONE HCL 1 MG/ML IJ SOLN
INTRAMUSCULAR | Status: AC
  Filled 2021-09-04: qty 0.5

## 2021-09-04 MED ORDER — ONDANSETRON HCL 4 MG/2ML IJ SOLN: 4.0000 mg | Freq: Four times a day (QID) | INTRAMUSCULAR | Status: DC | PRN

## 2021-09-04 MED ORDER — OXYCODONE HCL 5 MG PO TABS
10.0000 mg | ORAL_TABLET | ORAL | Status: DC | PRN
  Administered 2021-09-04: 15 mg via ORAL
  Filled 2021-09-04: qty 3

## 2021-09-04 MED ORDER — CHLORHEXIDINE GLUCONATE 4 % EX LIQD
60.0000 mL | Freq: Once | CUTANEOUS | Status: DC
  Filled 2021-09-04: qty 60

## 2021-09-04 MED ORDER — MIDAZOLAM HCL 2 MG/2ML IJ SOLN
INTRAMUSCULAR | Status: DC | PRN
  Administered 2021-09-04: 2 mg via INTRAVENOUS

## 2021-09-04 MED ORDER — ROCURONIUM BROMIDE 10 MG/ML (PF) SYRINGE
PREFILLED_SYRINGE | INTRAVENOUS | Status: AC
  Filled 2021-09-04: qty 20

## 2021-09-04 MED ORDER — ONDANSETRON HCL 4 MG PO TABS: 4.0000 mg | ORAL_TABLET | Freq: Four times a day (QID) | ORAL | Status: DC | PRN

## 2021-09-04 MED ORDER — METOCLOPRAMIDE HCL 5 MG/ML IJ SOLN: 5.0000 mg | Freq: Three times a day (TID) | INTRAMUSCULAR | Status: DC | PRN

## 2021-09-04 MED ORDER — METOCLOPRAMIDE HCL 5 MG PO TABS: 5.0000 mg | ORAL_TABLET | Freq: Three times a day (TID) | ORAL | Status: DC | PRN

## 2021-09-04 MED ORDER — ACETAMINOPHEN 10 MG/ML IV SOLN
INTRAVENOUS | Status: DC | PRN
  Administered 2021-09-04: 1000 mg via INTRAVENOUS

## 2021-09-04 MED ORDER — FENTANYL CITRATE (PF) 250 MCG/5ML IJ SOLN
INTRAMUSCULAR | Status: DC | PRN
  Administered 2021-09-04: 50 ug via INTRAVENOUS
  Administered 2021-09-04: 150 ug via INTRAVENOUS
  Administered 2021-09-04: 50 ug via INTRAVENOUS

## 2021-09-04 MED ORDER — VANCOMYCIN HCL IN DEXTROSE 1-5 GM/200ML-% IV SOLN
1000.0000 mg | INTRAVENOUS | Status: DC
  Filled 2021-09-04: qty 200

## 2021-09-04 MED ORDER — POVIDONE-IODINE 10 % EX SWAB: 2.0000 "application " | Freq: Once | CUTANEOUS | Status: DC

## 2021-09-04 MED ORDER — FENTANYL CITRATE (PF) 250 MCG/5ML IJ SOLN
INTRAMUSCULAR | Status: AC
  Filled 2021-09-04: qty 5

## 2021-09-04 MED ORDER — OXYCODONE HCL 5 MG PO TABS
5.0000 mg | ORAL_TABLET | ORAL | Status: DC | PRN
  Administered 2021-09-05: 10 mg via ORAL
  Administered 2021-09-05: 5 mg via ORAL
  Filled 2021-09-04: qty 1
  Filled 2021-09-04: qty 2

## 2021-09-04 MED ORDER — CHLORHEXIDINE GLUCONATE 0.12 % MT SOLN
15.0000 mL | Freq: Once | OROMUCOSAL | Status: AC
  Administered 2021-09-04: 15 mL via OROMUCOSAL

## 2021-09-04 MED ORDER — ONDANSETRON HCL 4 MG/2ML IJ SOLN
INTRAMUSCULAR | Status: DC | PRN
  Administered 2021-09-04: 4 mg via INTRAVENOUS

## 2021-09-04 MED ORDER — KETOROLAC TROMETHAMINE 30 MG/ML IJ SOLN
30.0000 mg | Freq: Once | INTRAMUSCULAR | Status: AC | PRN
  Administered 2021-09-04: 30 mg via INTRAVENOUS

## 2021-09-04 MED ORDER — CHLORHEXIDINE GLUCONATE 0.12 % MT SOLN: 15.0000 mL | Freq: Once | OROMUCOSAL | Status: DC

## 2021-09-04 MED ORDER — HYDROMORPHONE HCL 1 MG/ML IJ SOLN
0.2500 mg | INTRAMUSCULAR | Status: DC | PRN
  Administered 2021-09-04: 0.5 mg via INTRAVENOUS

## 2021-09-04 MED ORDER — ACETAMINOPHEN 325 MG PO TABS: 325.0000 mg | ORAL_TABLET | Freq: Four times a day (QID) | ORAL | Status: DC | PRN

## 2021-09-04 MED ORDER — ENOXAPARIN SODIUM 40 MG/0.4ML IJ SOSY: 40.0000 mg | PREFILLED_SYRINGE | INTRAMUSCULAR | Status: DC

## 2021-09-04 MED ORDER — OXYCODONE HCL 5 MG/5ML PO SOLN: 5.0000 mg | Freq: Once | ORAL | Status: DC | PRN

## 2021-09-04 MED ORDER — POTASSIUM CHLORIDE IN NACL 20-0.9 MEQ/L-% IV SOLN: INTRAVENOUS | Status: DC

## 2021-09-04 MED ORDER — METHOCARBAMOL 500 MG PO TABS
1000.0000 mg | ORAL_TABLET | Freq: Three times a day (TID) | ORAL | Status: DC
  Administered 2021-09-04 – 2021-09-05 (×2): 1000 mg via ORAL
  Filled 2021-09-04 (×2): qty 2

## 2021-09-04 SURGICAL SUPPLY — 59 items
BAG COUNTER SPONGE SURGICOUNT (BAG) ×3 IMPLANT
BAG SPNG CNTER NS LX DISP (BAG) ×1
BIT DRILL 3.8X6 NS (BIT) ×1 IMPLANT
BIT DRILL 4.4 NS (BIT) ×1 IMPLANT
BLADE SURG 10 STRL SS (BLADE) ×3 IMPLANT
BNDG ELASTIC 4X5.8 VLCR STR LF (GAUZE/BANDAGES/DRESSINGS) ×3 IMPLANT
BNDG ELASTIC 6X5.8 VLCR STR LF (GAUZE/BANDAGES/DRESSINGS) ×3 IMPLANT
BNDG GAUZE ELAST 4 BULKY (GAUZE/BANDAGES/DRESSINGS) ×1 IMPLANT
BRUSH SCRUB EZ PLAIN DRY (MISCELLANEOUS) ×6 IMPLANT
COVER SURGICAL LIGHT HANDLE (MISCELLANEOUS) ×6 IMPLANT
DRAPE C-ARM 42X72 X-RAY (DRAPES) ×3 IMPLANT
DRAPE C-ARMOR (DRAPES) ×3 IMPLANT
DRAPE HALF SHEET 40X57 (DRAPES) IMPLANT
DRAPE INCISE IOBAN 66X45 STRL (DRAPES) IMPLANT
DRAPE U-SHAPE 47X51 STRL (DRAPES) ×3 IMPLANT
DRSG ADAPTIC 3X8 NADH LF (GAUZE/BANDAGES/DRESSINGS) ×2 IMPLANT
DRSG MEPITEL 4X7.2 (GAUZE/BANDAGES/DRESSINGS) ×3 IMPLANT
DRSG PAD ABDOMINAL 8X10 ST (GAUZE/BANDAGES/DRESSINGS) ×5 IMPLANT
ELECT REM PT RETURN 9FT ADLT (ELECTROSURGICAL) ×2
ELECTRODE REM PT RTRN 9FT ADLT (ELECTROSURGICAL) ×2 IMPLANT
GAUZE SPONGE 4X4 12PLY STRL (GAUZE/BANDAGES/DRESSINGS) ×3 IMPLANT
GLOVE BIO SURGEON STRL SZ7.5 (GLOVE) ×3 IMPLANT
GLOVE BIO SURGEON STRL SZ8 (GLOVE) ×3 IMPLANT
GLOVE BIO SURGEON STRL SZ8.5 (GLOVE) ×3 IMPLANT
GLOVE BIOGEL PI IND STRL 7.5 (GLOVE) ×2 IMPLANT
GLOVE BIOGEL PI IND STRL 8 (GLOVE) ×2 IMPLANT
GLOVE BIOGEL PI INDICATOR 7.5 (GLOVE) ×1
GLOVE BIOGEL PI INDICATOR 8 (GLOVE) ×1
GLOVE SURG ORTHO LTX SZ7.5 (GLOVE) ×6 IMPLANT
GOWN STRL REUS W/ TWL LRG LVL3 (GOWN DISPOSABLE) ×4 IMPLANT
GOWN STRL REUS W/ TWL XL LVL3 (GOWN DISPOSABLE) ×2 IMPLANT
GOWN STRL REUS W/TWL LRG LVL3 (GOWN DISPOSABLE) ×4
GOWN STRL REUS W/TWL XL LVL3 (GOWN DISPOSABLE) ×2
GUIDEWIRE BALL NOSE 80CM (WIRE) ×1 IMPLANT
KIT BASIN OR (CUSTOM PROCEDURE TRAY) ×3 IMPLANT
KIT TURNOVER KIT B (KITS) ×3 IMPLANT
NAIL TIBIAL 9MMX37.5CM (Nail) ×1 IMPLANT
PACK ORTHO EXTREMITY (CUSTOM PROCEDURE TRAY) ×3 IMPLANT
PAD ARMBOARD 7.5X6 YLW CONV (MISCELLANEOUS) ×6 IMPLANT
PAD CAST 4YDX4 CTTN HI CHSV (CAST SUPPLIES) ×2 IMPLANT
PADDING CAST COTTON 4X4 STRL (CAST SUPPLIES) ×2
PADDING CAST COTTON 6X4 STRL (CAST SUPPLIES) ×3 IMPLANT
SCREW ACECAP 40MM (Screw) ×1 IMPLANT
SCREW ACECAP 50MM (Screw) ×1 IMPLANT
SCREW PROXIMAL DEPUY (Screw) ×4 IMPLANT
SCREW PRXML FT 45X5.5XLCK NS (Screw) IMPLANT
SCREW PRXML FT 55X5.5XNS TIB (Screw) IMPLANT
SPONGE T-LAP 18X18 ~~LOC~~+RFID (SPONGE) ×1 IMPLANT
STAPLER VISISTAT 35W (STAPLE) ×3 IMPLANT
SUT ETHILON 2 0 FS 18 (SUTURE) ×7 IMPLANT
SUT VIC AB 0 CT1 27 (SUTURE)
SUT VIC AB 0 CT1 27XBRD ANBCTR (SUTURE) IMPLANT
SUT VIC AB 1 CT1 27 (SUTURE) ×2
SUT VIC AB 1 CT1 27XBRD ANBCTR (SUTURE) IMPLANT
SUT VIC AB 2-0 CT1 27 (SUTURE) ×2
SUT VIC AB 2-0 CT1 TAPERPNT 27 (SUTURE) ×2 IMPLANT
TOWEL GREEN STERILE (TOWEL DISPOSABLE) ×6 IMPLANT
TOWEL GREEN STERILE FF (TOWEL DISPOSABLE) ×3 IMPLANT
YANKAUER SUCT BULB TIP NO VENT (SUCTIONS) IMPLANT

## 2021-09-04 NOTE — Progress Notes (Signed)
Orthopedic Tech Progress Note ?Patient Details:  ?Calvin Simpson ?09/06/1985 ?[id number] ? ?Verbal order called in from OR 3 for an XL CAM boot. It was dropped off at the OR desk. ? ?Ortho Devices ?Type of Ortho Device: CAM walker ?Ortho Device/Splint Location: Dropped off at OR desk, for OR 3 ?Ortho Device/Splint Interventions: Ordered ?  ?Post Interventions ?Patient Tolerated: Well ? ?Verlaine Embry Carmine Savoy ?09/04/2021, 6:14 PM ? ?

## 2021-09-04 NOTE — Transfer of Care (Signed)
Immediate Anesthesia Transfer of Care Note ? ?Patient: Calvin Simpson ? ?Procedure(s) Performed: INTRAMEDULLARY (IM) NAIL TIBIAL (Right) ? ?Patient Location: PACU ? ?Anesthesia Type:General ? ?Level of Consciousness: drowsy ? ?Airway & Oxygen Therapy: Patient Spontanous Breathing ? ?Post-op Assessment: Report given to RN and Post -op Vital signs reviewed and stable ? ?Post vital signs: Reviewed and stable ? ?Last Vitals:  ?Vitals Value Taken Time  ?BP 147/90   ?Temp    ?Pulse 90 09/04/21 1854  ?Resp 18 09/04/21 1854  ?SpO2 96 % 09/04/21 1854  ?Vitals shown include unvalidated device data. ? ?Last Pain:  ?Vitals:  ? 09/04/21 1513  ?TempSrc: Oral  ?PainSc:   ?   ? ?Patients Stated Pain Goal: 4 (09/04/21 1304) ? ?Complications: No notable events documented. ?

## 2021-09-04 NOTE — Progress Notes (Signed)
Orthopedic Tech Progress Note ?Patient Details:  ?GABRIEN MENTINK ?[birth year] ?[id number] ? ?Ortho Devices ?Type of Ortho Device: Stirrup splint, Post (short leg) splint ?Ortho Device/Splint Location: RLE ?Ortho Device/Splint Interventions: Ordered, Application, Adjustment ?  ?Post Interventions ?Patient Tolerated: Well ?Splint applied as is with the assistance of PA and EMT. ?Darleen Crocker ?09/04/2021, 12:02 AM ? ?

## 2021-09-04 NOTE — Anesthesia Preprocedure Evaluation (Addendum)
Anesthesia Evaluation  ?Patient identified by MRN, date of birth, ID band ?Patient awake ? ? ? ?Reviewed: ?Allergy & Precautions, NPO status , Patient's Chart, lab work & pertinent test results ? ?Airway ?Mallampati: III ? ?TM Distance: >3 FB ?Neck ROM: Full ? ? ? Dental ?no notable dental hx. ? ?  ?Pulmonary ?former smoker,  ?  ?Pulmonary exam normal ? ? ? ? ? ? ? Cardiovascular ?negative cardio ROS ?Normal cardiovascular exam ? ? ?  ?Neuro/Psych ?negative neurological ROS ? negative psych ROS  ? GI/Hepatic ?Neg liver ROS, GERD  Controlled and Medicated,  ?Endo/Other  ?negative endocrine ROS ? Renal/GU ?negative Renal ROS  ?negative genitourinary ?  ?Musculoskeletal ?negative musculoskeletal ROS ?(+)  ? Abdominal ?  ?Peds ? Hematology ?negative hematology ROS ?(+)   ?Anesthesia Other Findings ?right tibial nail fracture ? Reproductive/Obstetrics ? ?  ? ? ? ? ? ? ? ? ? ? ? ? ? ?  ?  ? ? ? ? ? ? ? ?Anesthesia Physical ?Anesthesia Plan ? ?ASA: 1 ? ?Anesthesia Plan: General  ? ?Post-op Pain Management: Dilaudid IV  ? ?Induction: Intravenous ? ?PONV Risk Score and Plan: 2 and Ondansetron, Dexamethasone, Midazolam and Treatment may vary due to age or medical condition ? ?Airway Management Planned: Oral ETT ? ?Additional Equipment:  ? ?Intra-op Plan:  ? ?Post-operative Plan: Extubation in OR ? ?Informed Consent: I have reviewed the patients History and Physical, chart, labs and discussed the procedure including the risks, benefits and alternatives for the proposed anesthesia with the patient or authorized representative who has indicated his/her understanding and acceptance.  ? ? ? ?Dental advisory given ? ?Plan Discussed with: CRNA ? ?Anesthesia Plan Comments:   ? ? ? ? ? ?Anesthesia Quick Evaluation ? ?
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
## 2021-09-04 NOTE — Anesthesia Procedure Notes (Signed)
Procedure Name: Intubation ?Date/Time: 09/04/2021 4:55 PM ?Performed by: Carolan Clines, CRNA ?Pre-anesthesia Checklist: Patient identified, Emergency Drugs available, Suction available and Patient being monitored ?Patient Re-evaluated:Patient Re-evaluated prior to induction ?Oxygen Delivery Method: Circle System Utilized ?Preoxygenation: Pre-oxygenation with 100% oxygen ?Induction Type: IV induction ?Ventilation: Oral airway inserted - appropriate to patient size and Two handed mask ventilation required ?Laryngoscope Size: Mac and 4 ?Grade View: Grade I ?Tube type: Oral ?Tube size: 7.5 mm ?Number of attempts: 1 ?Airway Equipment and Method: Stylet and Oral airway ?Placement Confirmation: ETT inserted through vocal cords under direct vision, positive ETCO2 and breath sounds checked- equal and bilateral ?Secured at: 22 cm ?Tube secured with: Tape ?Dental Injury: Teeth and Oropharynx as per pre-operative assessment  ?Comments: Two hand mask to achieve adequate seal related to pt's beard. ? ? ? ? ?
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
## 2021-09-04 NOTE — Plan of Care (Signed)
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
## 2021-09-04 NOTE — TOC CAGE-AID Note (Signed)
Transition of Care (TOC) - CAGE-AID Screening ? ? ?Patient Details  ?Name: Calvin Simpson ?MRN: [id number] ?Date of Birth: [birth year] ? ?Transition of Care (TOC) CM/SW Contact:    ?Cleola Perryman C Tarpley-Carter, LCSWA ?Phone Number: ?09/04/2021, 12:49 PM ? ? ?Clinical Narrative: ?Pt participated in Cage-Aid.  Pt stated he does not use substance or ETOH.  Pt was not offered resources, due to no usage of substance or ETOH.    ? ?Insurance underwriter, MSW, LCSW-A ?Pronouns:  She/Her/Hers ?[facility] ?Clinical Social Worker ?Direct Number:  [phone] ?[email or web address]  ? ?CAGE-AID Screening: ?  ? ?Have You Ever Felt You Ought to Cut Down on Your Drinking or Drug Use?: No ?Have People Annoyed You By Critizing Your Drinking Or Drug Use?: No ?Have You Felt Bad Or Guilty About Your Drinking Or Drug Use?: No ?Have You Ever Had a Drink or Used Drugs First Thing In The Morning to Steady Your Nerves or to Get Rid of a Hangover?: No ?CAGE-AID Score: 0 ? ?  ? ?  ? ? ? ? ? ? ?

## 2021-09-04 NOTE — Plan of Care (Signed)
  Problem: Clinical Measurements: Goal: Respiratory complications will improve Outcome: Progressing Goal: Cardiovascular complication will be avoided Outcome: Progressing   Problem: Coping: Goal: Level of anxiety will decrease Outcome: Progressing   

## 2021-09-05 ENCOUNTER — Encounter (HOSPITAL_COMMUNITY): Payer: Self-pay | Admitting: Orthopedic Surgery

## 2021-09-05 DIAGNOSIS — S82401A Unspecified fracture of shaft of right fibula, initial encounter for closed fracture: Secondary | ICD-10-CM

## 2021-09-05 HISTORY — DX: Unspecified fracture of shaft of right fibula, initial encounter for closed fracture: S82.401A

## 2021-09-05 LAB — CBC
HCT: 40.5 % (ref 39.0–52.0)
Hemoglobin: 14.2 g/dL (ref 13.0–17.0)
MCH: 32.1 pg (ref 26.0–34.0)
MCHC: 35.1 g/dL (ref 30.0–36.0)
MCV: 91.4 fL (ref 80.0–100.0)
Platelets: 239 10*3/uL (ref 150–400)
RBC: 4.43 MIL/uL (ref 4.22–5.81)
RDW: 12.5 % (ref 11.5–15.5)
WBC: 8.7 10*3/uL (ref 4.0–10.5)
nRBC: 0 % (ref 0.0–0.2)

## 2021-09-05 MED ORDER — RIVAROXABAN 10 MG PO TABS: 10.0000 mg | ORAL_TABLET | Freq: Every day | ORAL | 0 refills | Status: DC

## 2021-09-05 MED ORDER — OXYCODONE-ACETAMINOPHEN 5-325 MG PO TABS: 1.0000 | ORAL_TABLET | Freq: Four times a day (QID) | ORAL | 0 refills | Status: AC | PRN

## 2021-09-05 MED ORDER — METHOCARBAMOL 1000 MG PO TABS: 500.0000 mg | ORAL_TABLET | Freq: Four times a day (QID) | ORAL | 0 refills | Status: DC | PRN

## 2021-09-05 MED ORDER — KETOROLAC TROMETHAMINE 10 MG PO TABS
10.0000 mg | ORAL_TABLET | Freq: Four times a day (QID) | ORAL | 0 refills | Status: DC | PRN
Start: 2021-09-05 — End: 2022-09-26

## 2021-09-05 MED ORDER — ACETAMINOPHEN 500 MG PO TABS: 500.0000 mg | ORAL_TABLET | Freq: Four times a day (QID) | ORAL | 0 refills | Status: DC

## 2021-09-05 MED ORDER — DOCUSATE SODIUM 100 MG PO CAPS: 100.0000 mg | ORAL_CAPSULE | Freq: Two times a day (BID) | ORAL | 0 refills | Status: DC

## 2021-09-05 MED ORDER — ONDANSETRON 4 MG PO TBDP: 4.0000 mg | ORAL_TABLET | Freq: Three times a day (TID) | ORAL | 0 refills | Status: DC | PRN

## 2021-09-05 NOTE — Anesthesia Postprocedure Evaluation (Signed)
Anesthesia Post Note ? ?Patient: Calvin Simpson ? ?Procedure(s) Performed: INTRAMEDULLARY (IM) NAIL TIBIAL (Right) ? ?  ? ?Patient location during evaluation: PACU ?Anesthesia Type: General ?Level of consciousness: awake ?Pain management: pain level controlled ?Vital Signs Assessment: post-procedure vital signs reviewed and stable ?Respiratory status: spontaneous breathing, nonlabored ventilation, respiratory function stable and patient connected to nasal cannula oxygen ?Cardiovascular status: blood pressure returned to baseline and stable ?Postop Assessment: no apparent nausea or vomiting ?Anesthetic complications: no ? ? ?No notable events documented. ? ?Last Vitals:  ?Vitals:  ? 09/05/21 0419 09/05/21 0756  ?BP: 117/63 127/68  ?Pulse: 75 89  ?Resp:  17  ?Temp: 36.7 ?C 36.8 ?C  ?SpO2: 95% 98%  ?  ?Last Pain:  ?Vitals:  ? 09/05/21 1020  ?TempSrc:   ?PainSc: 3   ? ? ?  ?  ?  ?  ?  ?  ? ?[place] P Cozy [place] ? ? ? ? ?
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
## 2021-09-05 NOTE — Progress Notes (Signed)
Occupational Therapy Discharge ?Patient Details ?Name: STPEHEN PETITJEAN ?MRN: [id number] ?DOB: 1985-12-27 ?Today's Date: 09/05/2021 ?Time:  -  ?  ? ?Patient discharged from OT services secondary to goals met and no further OT needs identified. (PT [place] for OT) ? ?Please see latest therapy progress note for current level of functioning and progress toward goals.   ? ?Progress and discharge plan discussed with patient and/or caregiver: Patient/Caregiver agrees with plan ? ?GO ?   ? ?Jeri Modena ?09/05/2021, 10:20 AM  ?

## 2021-09-05 NOTE — Progress Notes (Signed)
PT AVS reviewed and pt verbalized understanding of all DC teaching and instructions. Pt has all pt belongings in pt possession.  ?
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
## 2021-09-05 NOTE — Discharge Instructions (Signed)
? ?Orthopaedic Trauma Service Discharge Instructions ? ? ?General Discharge Instructions ? ?Orthopaedic Injuries: ? Closed Right tibia and fibula fracture treated with intramedullary nailing  ? ?WEIGHT BEARING STATUS: Partial weightbearing right leg (50%), use crutches ? ?RANGE OF MOTION/ACTIVITY: Activity as tolerated while maintaining weightbearing restrictions.  Unrestricted range of motion of right knee.  Boot needs to be on when ambulating and when sleeping.  Okay to come out of the boot periodically for ankle motion ? ? ?Wound Care: Daily wound care starting on 09/07/2021.  Please see below ? ?Discharge Wound Care Instructions ? ?Do NOT apply any ointments, solutions or lotions to pin sites or surgical wounds.  These prevent needed drainage and even though solutions like hydrogen peroxide kill bacteria, they also damage cells lining the pin sites that help fight infection.  Applying lotions or ointments can keep the wounds moist and can cause them to breakdown and open up as well. This can increase the risk for infection. When in doubt call the office. ? ?Surgical incisions should be dressed daily. ? ?If any drainage is noted, use one layer of adaptic or Mepitel, then gauze, Kerlix, and an ace wrap.  Alternatively you can use a Mepilex dressing which is a silicone foam dressing or you can use 4 x 4 gauze and tape ? ?NetCamper.czhttps://www.amazon.com/Johnson-Systagenix-ADAPTIC-Non-Adhering-Dressing/dp/B000ODY90A ?https://dennis-soto.com/https://www.amazon.com/Mepitel-Wound-Dressing-4x7-Each/dp/B01LMO5C6O/ref=pd_lpo_3?pd_rd_i=B01LMO5C6O&th=1 ? ?http://rojas.com/https://www.amazon.com/Silicone-Dressing-Border-Adhesive-Waterproof/dp/B07MNV4CQJ ? ?These dressing supplies should be available at local medical supply stores ([employer], [employer], etc). They are not usually carried at places like [facility], [facility], [facility], etc ? ?Once the incision is completely dry and without drainage, it may be left open to air out.  Showering may begin 36-48 hours later.   Cleaning gently with soap and water. ? ?  ? ?DVT/PE prophylaxis: Xarelto 10 mg daily x 30 days ? ?Diet: as you were eating previously.  Can use over the counter stool softeners and bowel preparations, such as Miralax, to help with bowel movements.  Narcotics can be constipating.  Be sure to drink plenty of fluids ? ?PAIN MEDICATION USE AND EXPECTATIONS ? You have likely been given narcotic medications to help control your pain.  After a traumatic event that results in an fracture (broken bone) with or without surgery, it is ok to use narcotic pain medications to help control one's pain.  We understand that everyone responds to pain differently and each individual patient will be evaluated on a regular basis for the continued need for narcotic medications. Ideally, narcotic medication use should last no more than 6-8 weeks (coinciding with fracture healing).  ? As a patient it is your responsibility as well to monitor narcotic medication use and report the amount and frequency you use these medications when you come to your office visit.  ? We would also advise that if you are using narcotic medications, you should take a dose prior to therapy to maximize you participation. ? ?IF YOU ARE ON NARCOTIC MEDICATIONS IT IS NOT PERMISSIBLE TO OPERATE A MOTOR VEHICLE (MOTORCYCLE/CAR/TRUCK/MOPED) OR HEAVY MACHINERY ?DO NOT MIX NARCOTICS WITH OTHER CNS (CENTRAL NERVOUS SYSTEM) DEPRESSANTS SUCH AS ALCOHOL ? ? ?POST-OPERATIVE OPIOID TAPER INSTRUCTIONS: ?It is important to wean off of your opioid medication as soon as possible. If you do not need pain medication after your surgery it is ok to stop day one. ?Opioids include: ?Codeine, Hydrocodone(Norco, Vicodin), Oxycodone(Percocet, oxycontin) and hydromorphone amongst others.  ?Long term and even short term use of opiods can cause: ?Increased pain response ?Dependence ?Constipation ?Depression ?Respiratory depression ?And more.  ?Withdrawal symptoms can include ?  Flu like  symptoms ?Nausea, vomiting ?And more ?Techniques to manage these symptoms ?Hydrate well ?Eat regular healthy meals ?Stay active ?Use relaxation techniques(deep breathing, meditating, yoga) ?Do Not substitute Alcohol to help with tapering ?If you have been on opioids for less than two weeks and do not have pain than it is ok to stop all together.  ?Plan to wean off of opioids ?This plan should start within one week post op of your fracture surgery  ?Maintain the same interval or time between taking each dose and first decrease the dose.  ?Cut the total daily intake of opioids by one tablet each day ?Next start to increase the time between doses. ?The last dose that should be eliminated is the evening dose.  ? ? ?STOP SMOKING OR USING NICOTINE PRODUCTS!!!! ? As discussed nicotine severely impairs your body's ability to heal surgical and traumatic wounds but also impairs bone healing.  Wounds and bone heal by forming microscopic blood vessels (angiogenesis) and nicotine is a vasoconstrictor (essentially, shrinks blood vessels).  Therefore, if vasoconstriction occurs to these microscopic blood vessels they essentially disappear and are unable to deliver necessary nutrients to the healing tissue.  This is one modifiable factor that you can do to dramatically increase your chances of healing your injury.   ? (This means no smoking, no nicotine gum, patches, etc) ? ?DO NOT USE NONSTEROIDAL ANTI-INFLAMMATORY DRUGS (NSAID'S) ? Using products such as Advil (ibuprofen), Aleve (naproxen), Motrin (ibuprofen) for additional pain control during fracture healing can delay and/or prevent the healing response.  If you would like to take over the counter (OTC) medication, Tylenol (acetaminophen) is ok.  However, some narcotic medications that are given for pain control contain acetaminophen as well. Therefore, you should not exceed more than 4000 mg of tylenol in a day if you do not have liver disease.  Also note that there are may  OTC medicines, such as cold medicines and allergy medicines that my contain tylenol as well.  If you have any questions about medications and/or interactions please ask your doctor/PA or your pharmacist.  ?   ? ?ICE AND ELEVATE INJURED/OPERATIVE EXTREMITY ? Using ice and elevating the injured extremity above your heart can help with swelling and pain control.  Icing in a pulsatile fashion, such as 20 minutes on and 20 minutes off, can be followed.   ? Do not place ice directly on skin. Make sure there is a barrier between to skin and the ice pack.   ? Using frozen items such as frozen peas works well as the conform nicely to the are that needs to be iced. ? ?USE AN ACE WRAP OR TED HOSE FOR SWELLING CONTROL ? In addition to icing and elevation, Ace wraps or TED hose are used to help limit and resolve swelling.  It is recommended to use Ace wraps or TED hose until you are informed to stop.   ? When using Ace Wraps start the wrapping distally (farthest away from the body) and wrap proximally (closer to the body) ?  Example: If you had surgery on your leg or thing and you do not have a splint on, start the ace wrap at the toes and work your way up to the thigh ?       If you had surgery on your upper extremity and do not have a splint on, start the ace wrap at your fingers and work your way up to the upper arm ? ?IF YOU ARE IN A SPLINT  OR CAST DO NOT REMOVE IT FOR ANY REASON  ? If your splint gets wet for any reason please contact the office immediately. You may shower in your splint or cast as long as you keep it dry.  This can be done by wrapping in a cast cover or garbage back (or similar) ? Do Not stick any thing down your splint or cast such as pencils, money, or hangers to try and scratch yourself with.  If you feel itchy take benadryl as prescribed on the bottle for itching ? ?IF YOU ARE IN A CAM BOOT (BLACK BOOT) ? You may remove boot periodically. Perform daily dressing changes as noted below.  Wash the liner  of the boot regularly and wear a sock when wearing the boot. It is recommended that you sleep in the boot until told otherwise ? ? ? ?Call office for the following: ?Temperature greater than 101F ?Persistent naus
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
## 2021-09-17 DIAGNOSIS — S82251D Displaced comminuted fracture of shaft of right tibia, subsequent encounter for closed fracture with routine healing: Secondary | ICD-10-CM | POA: Diagnosis not present

## 2021-09-17 DIAGNOSIS — S82461D Displaced segmental fracture of shaft of right fibula, subsequent encounter for closed fracture with routine healing: Secondary | ICD-10-CM | POA: Diagnosis not present

## 2021-10-15 DIAGNOSIS — S82461D Displaced segmental fracture of shaft of right fibula, subsequent encounter for closed fracture with routine healing: Secondary | ICD-10-CM | POA: Diagnosis not present

## 2021-10-15 DIAGNOSIS — S82251D Displaced comminuted fracture of shaft of right tibia, subsequent encounter for closed fracture with routine healing: Secondary | ICD-10-CM | POA: Diagnosis not present

## 2021-11-26 DIAGNOSIS — S82461D Displaced segmental fracture of shaft of right fibula, subsequent encounter for closed fracture with routine healing: Secondary | ICD-10-CM | POA: Diagnosis not present

## 2021-11-26 DIAGNOSIS — S82251D Displaced comminuted fracture of shaft of right tibia, subsequent encounter for closed fracture with routine healing: Secondary | ICD-10-CM | POA: Diagnosis not present

## 2022-09-26 ENCOUNTER — Encounter (HOSPITAL_BASED_OUTPATIENT_CLINIC_OR_DEPARTMENT_OTHER): Payer: Self-pay | Admitting: Emergency Medicine

## 2022-09-26 ENCOUNTER — Emergency Department (HOSPITAL_BASED_OUTPATIENT_CLINIC_OR_DEPARTMENT_OTHER): Payer: BC Managed Care – PPO

## 2022-09-26 ENCOUNTER — Emergency Department (HOSPITAL_BASED_OUTPATIENT_CLINIC_OR_DEPARTMENT_OTHER)
Admission: EM | Admit: 2022-09-26 | Discharge: 2022-09-26 | Disposition: A | Discharge status: Home / Self Care | Payer: BC Managed Care – PPO | Attending: Emergency Medicine | Admitting: Emergency Medicine

## 2022-09-26 ENCOUNTER — Other Ambulatory Visit: Payer: Self-pay

## 2022-09-26 DIAGNOSIS — S82201D Unspecified fracture of shaft of right tibia, subsequent encounter for closed fracture with routine healing: Secondary | ICD-10-CM | POA: Diagnosis not present

## 2022-09-26 DIAGNOSIS — Z4789 Encounter for other orthopedic aftercare: Secondary | ICD-10-CM | POA: Diagnosis not present

## 2022-09-26 DIAGNOSIS — Y92410 Unspecified street and highway as the place of occurrence of the external cause: Secondary | ICD-10-CM | POA: Insufficient documentation

## 2022-09-26 DIAGNOSIS — S8011XA Contusion of right lower leg, initial encounter: Secondary | ICD-10-CM | POA: Diagnosis not present

## 2022-09-26 DIAGNOSIS — R202 Paresthesia of skin: Secondary | ICD-10-CM | POA: Insufficient documentation

## 2022-09-26 DIAGNOSIS — M79604 Pain in right leg: Secondary | ICD-10-CM | POA: Diagnosis not present

## 2022-09-26 DIAGNOSIS — T1490XA Injury, unspecified, initial encounter: Secondary | ICD-10-CM | POA: Insufficient documentation

## 2022-09-26 MED ORDER — IBUPROFEN 600 MG PO TABS: 600.0000 mg | ORAL_TABLET | Freq: Four times a day (QID) | ORAL | 0 refills | Status: DC | PRN

## 2022-09-26 MED ORDER — NAPROXEN 250 MG PO TABS
500.0000 mg | ORAL_TABLET | Freq: Once | ORAL | Status: AC
  Administered 2022-09-26: 500 mg via ORAL
  Filled 2022-09-26: qty 2
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
## 2022-09-26 NOTE — ED Notes (Signed)
Pt resting in bed; denies pain; call light in reach.

## 2022-09-26 NOTE — Discharge Instructions (Addendum)
The x-ray is not revealing any evidence of new fracture.  There appears to be good healing going around the surgical site.  We recommend that you take ibuprofen every 6 hours for the next 3 to 5 days.  Ice the area of pain aggressively.  Use the knee immobilizer and crutches as needed when ambulating.  Call Dr. Carola Frost for a follow-up in 7 to 10 days.
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
## 2023-06-28 ENCOUNTER — Emergency Department (HOSPITAL_COMMUNITY)

## 2023-06-28 ENCOUNTER — Ambulatory Visit: Admission: EM | Admit: 2023-06-28 | Discharge: 2023-06-28

## 2023-06-28 ENCOUNTER — Encounter: Payer: Self-pay | Admitting: Emergency Medicine

## 2023-06-28 ENCOUNTER — Encounter (HOSPITAL_COMMUNITY): Payer: Self-pay

## 2023-06-28 ENCOUNTER — Emergency Department (HOSPITAL_COMMUNITY)
Admission: EM | Admit: 2023-06-28 | Discharge: 2023-06-28 | Disposition: A | Discharge status: Home / Self Care | Attending: Emergency Medicine | Admitting: Emergency Medicine

## 2023-06-28 ENCOUNTER — Other Ambulatory Visit: Payer: Self-pay

## 2023-06-28 DIAGNOSIS — I10 Essential (primary) hypertension: Secondary | ICD-10-CM | POA: Insufficient documentation

## 2023-06-28 DIAGNOSIS — R519 Headache, unspecified: Secondary | ICD-10-CM

## 2023-06-28 DIAGNOSIS — I160 Hypertensive urgency: Secondary | ICD-10-CM

## 2023-06-28 LAB — CBC
HCT: 48.7 % (ref 39.0–52.0)
Hemoglobin: 16.7 g/dL (ref 13.0–17.0)
MCH: 31.9 pg (ref 26.0–34.0)
MCHC: 34.3 g/dL (ref 30.0–36.0)
MCV: 93.1 fL (ref 80.0–100.0)
Platelets: 285 10*3/uL (ref 150–400)
RBC: 5.23 MIL/uL (ref 4.22–5.81)
RDW: 12.9 % (ref 11.5–15.5)
WBC: 5.7 10*3/uL (ref 4.0–10.5)
nRBC: 0 % (ref 0.0–0.2)

## 2023-06-28 LAB — BASIC METABOLIC PANEL
Anion gap: 10 (ref 5–15)
BUN: 10 mg/dL (ref 6–20)
CO2: 22 mmol/L (ref 22–32)
Calcium: 9.3 mg/dL (ref 8.9–10.3)
Chloride: 104 mmol/L (ref 98–111)
Creatinine, Ser: 1.06 mg/dL (ref 0.61–1.24)
GFR, Estimated: >60 mL/min (ref >60)
Glucose, Bld: 96 mg/dL (ref 70–99)
Potassium: 3.8 mmol/L (ref 3.5–5.1)
Sodium: 136 mmol/L (ref 135–145)

## 2023-06-28 MED ORDER — DIPHENHYDRAMINE HCL 50 MG/ML IJ SOLN
12.5000 mg | Freq: Once | INTRAMUSCULAR | Status: AC
  Administered 2023-06-28: 12.5 mg via INTRAVENOUS
  Filled 2023-06-28: qty 1

## 2023-06-28 MED ORDER — DEXAMETHASONE SODIUM PHOSPHATE 10 MG/ML IJ SOLN
10.0000 mg | Freq: Once | INTRAMUSCULAR | Status: AC
  Administered 2023-06-28: 10 mg via INTRAVENOUS
  Filled 2023-06-28: qty 1

## 2023-06-28 MED ORDER — KETOROLAC TROMETHAMINE 15 MG/ML IJ SOLN
15.0000 mg | Freq: Once | INTRAMUSCULAR | Status: AC
  Administered 2023-06-28: 15 mg via INTRAVENOUS
  Filled 2023-06-28: qty 1

## 2023-06-28 MED ORDER — METOCLOPRAMIDE HCL 5 MG/ML IJ SOLN
10.0000 mg | Freq: Once | INTRAMUSCULAR | Status: AC
  Administered 2023-06-28: 10 mg via INTRAVENOUS
  Filled 2023-06-28: qty 2

## 2023-06-28 NOTE — Discharge Instructions (Addendum)
 Please go to the emergency department at [facility] to be evaluated for your elevated blood pressure and occipital headache.  I suspect that your headache is secondary to your elevated blood pressure.  Please go now.

## 2023-06-28 NOTE — Discharge Instructions (Signed)
 Test results today are reassuring.  Continue Tylenol and ibuprofen as needed for headache.  Check your blood pressure at home when you are pain-free and in a calm environment.  If your blood pressure stays consistently elevated, you may benefit from a blood pressure medication.  Discuss this with a primary care doctor.  Return to the emergency department for any new or worsening symptoms of concern.

## 2023-06-28 NOTE — ED Triage Notes (Signed)
 Pt came in via POV d/t UC sending him here d/t HTN & HA he has had for the past 10 days. Reports OTC meds & what the tele-health provider told him to take has not alleviated any pain. Rates pain 8/10 during triage & denies taking any meds for HTN.

## 2023-06-28 NOTE — ED Triage Notes (Addendum)
 Patient presents with c/o headache x 10 days. Patient states he had virtual visit and was told to come here. Patient has tried tylenol and ibuprofen for the symptoms. Denies dizziness or light sensitivity.

## 2023-06-28 NOTE — ED Notes (Signed)
 Patient is being discharged from the Urgent Care and sent to the Emergency Department via PV . Per   Becky Augusta, NP  , patient is in need of higher level of care due to Hypertensive urgency and headache. Patient is aware and verbalizes understanding of plan of care.  Vitals:   06/28/23 0959 06/28/23 1006  BP: (!) 163/118 (!) 160/100  Pulse: 72   Resp: 18   Temp: 98 F (36.7 C)   SpO2: 100%

## 2023-06-28 NOTE — ED Provider Notes (Signed)
 [place] EMERGENCY DEPARTMENT AT [facility] Provider Note   CSN: [id number] Arrival date & time: 06/28/23  1128     History  Chief Complaint  Patient presents with   Headache   Hypertension    Calvin Simpson is a 38 y.o. male.   Headache Hypertension Associated symptoms include headaches.  Patient presents for headache.  He has no known chronic conditions.  For the past 10 days, he has had a posterior headache.  He was originally treating these with Goody's powder with minimal relief.  He had a telehealth visit last week and they told him to combine Tylenol, ibuprofen, and Excedrin.  He had ongoing headache today and went to urgent care.  At urgent care, they were concerned of his blood pressure which was elevated to 160s SBP.  He was sent to the ED for further evaluation.  Currently, patient endorses ongoing posterior headache but denies any other symptoms.     Home Medications Prior to Admission medications   Medication Sig Start Date End Date Taking? Authorizing Provider  ibuprofen (ADVIL) 600 MG tablet Take 1 tablet (600 mg total) by mouth every 6 (six) hours as needed. 09/26/22   Derwood Kaplan, MD      Allergies    Amoxicillin    Review of Systems   Review of Systems  Neurological:  Positive for headaches.  All other systems reviewed and are negative.   Physical Exam Updated Vital Signs BP (!) 143/101 (BP Location: Right Arm)   Pulse 78   Temp 98.4 F (36.9 C) (Oral)   Resp 15   Ht 6' (1.829 m)   Wt 86.2 kg   SpO2 99%   BMI 25.77 kg/m  Physical Exam Vitals and nursing note reviewed.  Constitutional:      General: He is not in acute distress.    Appearance: He is well-developed. He is not ill-appearing, toxic-appearing or diaphoretic.  HENT:     Head: Normocephalic and atraumatic.     Mouth/Throat:     Mouth: Mucous membranes are moist.  Eyes:     Extraocular Movements: Extraocular movements intact.     Conjunctiva/sclera: Conjunctivae  normal.  Cardiovascular:     Rate and Rhythm: Normal rate and regular rhythm.  Pulmonary:     Effort: Pulmonary effort is normal. No respiratory distress.  Abdominal:     General: There is no distension.     Palpations: Abdomen is soft.  Musculoskeletal:        General: No swelling. Normal range of motion.     Cervical back: Normal range of motion and neck supple.  Skin:    General: Skin is warm and dry.     Capillary Refill: Capillary refill takes less than 2 seconds.  Neurological:     Mental Status: He is alert and oriented to person, place, and time.     Cranial Nerves: No cranial nerve deficit, dysarthria or facial asymmetry.     Sensory: No sensory deficit.     Motor: No weakness.  Psychiatric:        Mood and Affect: Mood normal.        Behavior: Behavior normal.     ED Results / Procedures / Treatments   Labs (all labs ordered are listed, but only abnormal results are displayed) Labs Reviewed  BASIC METABOLIC PANEL  CBC    EKG None  Radiology CT HEAD WO CONTRAST Result Date: 06/28/2023 CLINICAL DATA:  Headache, increasing frequency or severity EXAM: CT  HEAD WITHOUT CONTRAST TECHNIQUE: Contiguous axial images were obtained from the base of the skull through the vertex without intravenous contrast. RADIATION DOSE REDUCTION: This exam was performed according to the departmental dose-optimization program which includes automated exposure control, adjustment of the mA and/or kV according to patient size and/or use of iterative reconstruction technique. COMPARISON:  None Available. FINDINGS: Brain: No evidence of acute infarction, hemorrhage, hydrocephalus, extra-axial collection or mass lesion/mass effect. Vascular: No hyperdense vessel. Skull: No acute fracture. Sinuses/Orbits: Clear sinuses.  No acute orbital findings. Other: No mastoid effusions. IMPRESSION: Normal head CT.  No evidence of acute abnormality. Electronically Signed   By: Feliberto Harts M.D.   On:  06/28/2023 21:40    Procedures Procedures    Medications Ordered in ED Medications  ketorolac (TORADOL) 15 MG/ML injection 15 mg (15 mg Intravenous Given 06/28/23 1714)  metoCLOPramide (REGLAN) injection 10 mg (10 mg Intravenous Given 06/28/23 1717)  diphenhydrAMINE (BENADRYL) injection 12.5 mg (12.5 mg Intravenous Given 06/28/23 1706)  dexamethasone (DECADRON) injection 10 mg (10 mg Intravenous Given 06/28/23 1719)    ED Course/ Medical Decision Making/ A&P                                 Medical Decision Making Amount and/or Complexity of Data Reviewed Labs: ordered. Radiology: ordered.  Risk Prescription drug management.   Patient is presenting for ongoing posterior headache for the past week and a half.  On arrival in the ED, blood pressure moderately elevated in the 150s over 100 range.  He is well-appearing on exam.  He has no focal neurologic deficits.  He was seen urgent care and they were worried about his elevated blood pressure.  I suspect this is response to his ongoing headache.  Will treat with headache cocktail.  CT of head was ordered.  Lab work, obtained prior to patient being bedded in the ED is reassuring.  On reassessment, patient had resolution of headache.  CT scan of head did not show acute findings.  Patient was discharged in stable condition.        Final Clinical Impression(s) / ED Diagnoses Final diagnoses:  Bad headache    Rx / DC Orders ED Discharge Orders     None         Gloris Manchester, MD 06/28/23 2208
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
## 2023-06-29 ENCOUNTER — Ambulatory Visit: Payer: Self-pay | Admitting: General Practice

## 2023-06-29 NOTE — Telephone Encounter (Signed)
 Copied from CRM [id number]. Topic: Clinical - Red Word Triage >> Jun 29, 2023  9:07 AM Gery Pray wrote: Red Word that prompted transfer to Nurse Triage: Patient has had hypertension for the past 10 days (151/96). Patient has had a headache for the past 10 days (headache is an 8). Patient has been to Urgent Care and the ED, however, they all state to take a Ibuprofen. Nothing has rectified the issue.    Chief Complaint: Headache Symptoms: Persistent headache and elevated BP Frequency: Ongoing for 11 days Pertinent Negatives: Patient denies dizziness, blurred vision, chest pain Disposition: [] ED /[] Urgent Care (no appt availability in office) / [x] Appointment(In office/virtual)/ []  [place] [facility]/ [] Home Care/ [] Refused Recommended Disposition /[] [place] [facility]/ []  Follow-up with PCP  Additional Notes: Patient stated he has been having headaches for the past 11 days. Initially, he had a telehealth visit, and he was advised to use Tylenol, Ibuprofen, Excedrin. He has used these remedies but the headaches persisted so he went to Urgent Care. Urgent Care advised him to go to ED due to elevated BP. He went to ED yesterday. CT scan was negative. He stated the headache is still present and this problem is interfering with his work. He took Ibuprofen this morning. His BP today has not been elevated. Patient is not established. New patient appointment scheduled for tomorrow.    Reason for Disposition  [1] MODERATE headache (e.g., interferes with normal activities) AND [2] present > 24 hours AND [3] unexplained  (Exceptions: analgesics not tried, typical migraine, or headache part of viral illness)  Answer Assessment - Initial Assessment Questions 1. LOCATION: "Where does it hurt?"      Head  2. ONSET: "When did the headache start?" (Minutes, hours or days)      Ongoing for 11 days   3. PATTERN: "Does the pain come and go, or has it been constant since it started?"     Constant for 11  days  4. SEVERITY: "How bad is the pain?" and "What does it keep you from doing?"  (e.g., Scale 1-10; mild, moderate, or severe)   - MILD (1-3): doesn't interfere with normal activities    - MODERATE (4-7): interferes with normal activities or awakens from sleep    - SEVERE (8-10): excruciating pain, unable to do any normal activities        7 or 8  5. RECURRENT SYMPTOM: "Have you ever had headaches before?" If Yes, ask: "When was the last time?" and "What happened that time?"      Yes, the headaches usually go away after taking OTC pain meds and do not last for this long  6. CAUSE: "What do you think is causing the headache?"     Elevated BP  7. OTHER SYMPTOMS: "Do you have any other symptoms?" (fever, stiff neck, eye pain, sore throat, cold symptoms)     No  Answer Assessment - Initial Assessment Questions 1. BLOOD PRESSURE: "What is the blood pressure?" "Did you take at least two measurements 5 minutes apart?"     BP 125/85, Pulse 90  2. ONSET: "When did you take your blood pressure?"     Earlier this morning around 7:00 am  3. HOW: "How did you take your blood pressure?" (e.g., automatic home BP monitor, visiting nurse)     Home BP monitor  4. HISTORY: "Do you have a history of high blood pressure?"     Patient does not, but hypertension runs in the family  5.  OTHER SYMPTOMS: "Do you have any symptoms?" (e.g., blurred vision, chest pain, difficulty breathing, headache, weakness)     Headache  Protocols used: Headache-A-AH, Blood Pressure - High-A-AH

## 2023-06-30 ENCOUNTER — Ambulatory Visit: Admitting: Family Medicine

## 2023-06-30 ENCOUNTER — Ambulatory Visit

## 2023-06-30 VITALS — BP 152/100 | HR 72 | Temp 98.6°F | Resp 18 | Ht 72.0 in | Wt 205.2 lb

## 2023-06-30 DIAGNOSIS — G4486 Cervicogenic headache: Secondary | ICD-10-CM | POA: Diagnosis not present

## 2023-06-30 DIAGNOSIS — M542 Cervicalgia: Secondary | ICD-10-CM | POA: Diagnosis not present

## 2023-06-30 DIAGNOSIS — R030 Elevated blood-pressure reading, without diagnosis of hypertension: Secondary | ICD-10-CM

## 2023-06-30 DIAGNOSIS — R519 Headache, unspecified: Secondary | ICD-10-CM | POA: Diagnosis not present

## 2023-06-30 MED ORDER — PREDNISONE 20 MG PO TABS: 40.0000 mg | ORAL_TABLET | Freq: Every day | ORAL | 0 refills | Status: AC

## 2023-06-30 MED ORDER — AMLODIPINE BESYLATE 5 MG PO TABS: 5.0000 mg | ORAL_TABLET | Freq: Every day | ORAL | 0 refills | Status: DC

## 2023-06-30 NOTE — Progress Notes (Signed)
 New Patient Office Visit  Subjective:  Patient ID: Calvin Simpson, male    DOB: 1985-06-21  Age: 38 y.o. MRN: [id number]  CC:  Chief Complaint  Patient presents with   Establish Care    Initial visit to establish care with new pcp Elevated blood pressure, seen at [facility] and ER, did blood work and imaging Fasting      Headache    Day 12 of headache, taking a lot of BC, radiates from top of head to neck and ear    HPI CHAPMAN MATTEUCCI presents for new pt  Discussed the use of AI scribe software for clinical note transcription with the patient, who gave verbal consent to proceed.  History of Present Illness   Calvin Simpson is a 38 year old male who presents with persistent headaches and elevated blood pressure. He is accompanied by his wife, Jill Side.  He has been experiencing a persistent headache for approximately two weeks, described as pounding and persistent. Over-the-counter medications like BC powders, Tylenol, Motrin, and Excedrin Migraine provide some relief but not complete resolution. The headache worsens in the evenings, coinciding with sunset, and is associated with a sensation of pressure and a 'rattly' sound in his ears. It is exacerbated by changes in position, particularly when transitioning from sitting to standing, and is accompanied by pain in the back of the neck. He sought telehealth advice on March 2nd and visited urgent care and the ER on March 3rd due to persistent symptoms and elevated blood pressure. A CT scan of the head and blood work were performed, which were unremarkable. He received a cocktail of medications including Toradol, Benadryl, Reglan, and dexamethasone, which provided temporary relief for about two to three hours. No history of migraines, recent illness, or significant changes in health prior to the onset of the headache.  no HA w/intercourse or squatting.  currently, pain 5/10  He reports a history of normal blood pressure readings, typically  around 110-115/70-75 but not checked in 2 months.  but recent measurements have been significantly elevated, with readings as high as 152/110. No chest pain, heart racing, shortness of breath, or swelling in the legs. His skin has been more red than usual during this period of headaches and elevated blood pressure.  He works as a Control and instrumentation engineer, which involves physical activity and stress. He does not smoke, drink alcohol, or use drugs. Family history includes high blood pressure in his father and grandfather, cancer in his father and grandmother, and glaucoma in his grandfather. His mother has COPD and smokes.        Current Outpatient Medications:    amLODipine (NORVASC) 5 MG tablet, Take 1 tablet (5 mg total) by mouth daily., Disp: 30 tablet, Rfl: 0   ibuprofen (ADVIL) 600 MG tablet, Take 1 tablet (600 mg total) by mouth every 6 (six) hours as needed., Disp: 20 tablet, Rfl: 0   Omeprazole Magnesium (PRILOSEC OTC PO), Take 20 mg by mouth daily., Disp: , Rfl:    predniSONE (DELTASONE) 20 MG tablet, Take 2 tablets (40 mg total) by mouth daily with breakfast for 5 days., Disp: 10 tablet, Rfl: 0  Past Medical History:  Diagnosis Date   Allergy 1994   Amoxicillin   Closed right fibular fracture 09/05/2021   Fx clavicle    Fx wrist    right    GERD (gastroesophageal reflux disease)     Past Surgical History:  Procedure Laterality Date   FRACTURE SURGERY  May 2023   Right tibia and fibula   TIBIA IM NAIL INSERTION Right 09/04/2021   Procedure: INTRAMEDULLARY (IM) NAIL TIBIAL;  Surgeon: Myrene Galas, MD;  Location: [facility] OR;  Service: Orthopedics;  Laterality: Right;   TONSILLECTOMY      Family History  Problem Relation Age of Onset   COPD Mother    Diabetes Father    Hypertension Father    Diabetes Paternal Grandmother    Hypertension Paternal Grandfather    Glaucoma Paternal Grandfather     Social History   Socioeconomic History   Marital status: Married     Spouse name: Not on file   Number of children: 2   Years of education: Not on file   Highest education level: Associate degree: occupational, Scientist, product/process development, or vocational program  Occupational History   Occupation: Curator    Comment: Social research officer, government  Tobacco Use   Smoking status: Former   Smokeless tobacco: Never   Tobacco comments:    patient states he vapes  Vaping Use   Vaping status: Never Used  Substance and Sexual Activity   Alcohol use: No   Drug use: No   Sexual activity: Yes    Birth control/protection: None  Other Topics Concern   Not on file  Social History Narrative   Not on file   Social Drivers of Health   Financial Resource Strain: Low Risk  (06/30/2023)   Overall Financial Resource Strain (CARDIA)    Difficulty of Paying Living Expenses: Not hard at all  Food Insecurity: No Food Insecurity (06/30/2023)   Hunger Vital Sign    Worried About Running Out of Food in the Last Year: Never true    Ran Out of Food in the Last Year: Never true  Transportation Needs: No Transportation Needs (06/30/2023)   PRAPARE - Administrator, Civil Service (Medical): No    Lack of Transportation (Non-Medical): No  Physical Activity: Sufficiently Active (06/30/2023)   Exercise Vital Sign    Days of Exercise per Week: 7 days    Minutes of Exercise per Session: 150+ min  Stress: Stress Concern Present (06/30/2023)   [employer] of Occupational Health - Occupational Stress Questionnaire    Feeling of Stress : To some extent  Social Connections: Moderately Integrated (06/30/2023)   Social Connection and Isolation Panel [NHANES]    Frequency of Communication with Friends and Family: More than three times a week    Frequency of Social Gatherings with Friends and Family: More than three times a week    Attends Religious Services: Never    Database administrator or Organizations: Yes    Attends Engineer, structural: More than 4 times per year    Marital Status:  Married  Catering manager Violence: Not on file    ROS  ROS: Gen: no fever, chills  Skin: no rash, itching ENT: no ear pain, ear drainage, nasal congestion, rhinorrhea, sinus pressure, sore throat Eyes: no blurry vision, double vision Resp: no cough, wheeze,SOB CV: no CP, palpitations, LE edema,  GI: no heartburn, n/v/d/c, abd pain GU: no dysuria, urgency, frequency, hematuria MSK: no joint pain, myalgias, back pain Neuro: no dizziness, headache, weakness, vertigo Psych: no depression, anxiety, insomnia, SI   Objective:   Today's Vitals: BP (!) 152/100 (BP Location: Left Arm, Patient Position: Sitting, Cuff Size: Normal)   Pulse 72   Temp 98.6 F (37 C) (Temporal)   Resp 18   Ht 6' (1.829 m)   Wt 205  lb 4 oz (93.1 kg)   SpO2 98%   BMI 27.84 kg/m   Physical Exam  Gen: WDWN NAD HEENT: NCAT, conjunctiva not injected, sclera nonicteric, PEERL NECK:  supple, no thyromegaly, no nodes, no carotid bruits CARDIAC: RRR, S1S2+, no murmur. DP 2+B LUNGS: CTAB. No wheezes ABDOMEN:  BS+, soft, NTND, No HSM, no masses EXT:  no edema MSK: no gross abnormalities.  NEURO: A&O x3.  CN II-XII intact.  PSYCH: normal mood. Good eye contact   Neck-no ttp spine.  +TTP suboccipital muscles.  No TTP temples/scalp.  + tight posterior neck muscles.   Reviewed ER  Assessment & Plan:  Elevated blood pressure reading  Cervicogenic headache -     DG Cervical Spine Complete; Future  Other orders -     predniSONE; Take 2 tablets (40 mg total) by mouth daily with breakfast for 5 days.  Dispense: 10 tablet; Refill: 0 -     amLODIPine Besylate; Take 1 tablet (5 mg total) by mouth daily.  Dispense: 30 tablet; Refill: 0  Assessment and Plan    Chronic Headache   Krystle has a persistent, pounding headache for two weeks, unrelieved by OTC medications like BC powder, Tylenol, Motrin, or Excedrin Migraine. The headache worsens in the evenings, with pressure, a metallic sound, neck pain, and  suboccipital tightness. A CT scan ruled out acute intracranial pathology. Differential diagnosis includes tension headache, cervicogenic headache, or secondary to hypertension. Start prednisone to reduce inflammation and alleviate the headache, with risks of increased blood pressure and blood sugar. Alternatives include continued symptomatic treatment with Tylenol. Order a cervical spine x-ray to assess for cervicogenic headache. Advise Tylenol for pain relief and avoid ibuprofen and BC powder while on prednisone. Return to ER if headache severity increases or new symptoms.  massage neck/suboccipitals.  stretches..  Hypertension   Bernette Redbird has elevated blood pressure readings at 152/110 mmHg, with previously normal levels until two months ago. It is unclear if hypertension is secondary to the headache or primary. There is a family history of hypertension. Prescribe amlodipine to prevent complications such as stroke and potentially aid in headache relief. Risks include dizziness, and discontinuation is advised if it occurs. Monitor blood pressure regularly and adjust treatment based on response.  General Health Maintenance   Joachim has a family history of glaucoma and was previously diagnosed with pre-glaucoma. He has not had an eye exam in two years. Recommend regular eye exams to monitor for glaucoma.        Follow-up: Return in about 1 week (around 07/07/2023) for ha/bp.   Angelena Sole, MD

## 2023-06-30 NOTE — Patient Instructions (Addendum)
 Welcome to [facility]! It was a pleasure meeting you today.  As discussed, Please schedule a 1 week follow up visit today.  Can take tylenol.    PLEASE NOTE:  If you had any LAB tests please let us know if you have not heard back within a few days. You may see your results on MyChart before we have a chance to review them but we will give you a call once they are reviewed by [place]. If we ordered any REFERRALS today, please let us know if you have not heard from their office within the next week.  Let us know through MyChart if you are needing REFILLS, or have your pharmacy send [place] the request. You can also use MyChart to communicate with me or any office staff.  Please try these tips to maintain a healthy lifestyle:  Eat most of your calories during the day when you are active. Eliminate processed foods including packaged sweets (pies, cakes, cookies), reduce intake of potatoes, white bread, white pasta, and white rice. Look for whole grain options, oat flour or almond flour.  Each meal should contain half fruits/vegetables, one quarter protein, and one quarter carbs (no bigger than a computer mouse).  Cut down on sweet beverages. This includes juice, soda, and sweet tea. Also watch fruit intake, though this is a healthier sweet option, it still contains natural sugar! Limit to 3 servings daily.  Drink at least 1 glass of water with each meal and aim for at least 8 glasses per day  Exercise at least 150 minutes every week.

## 2023-07-06 IMAGING — RF DG TIBIA/FIBULA 2V*R*
1 series · 9 of 9 positions shown · non-contrast
Comparison: 09/03/2021.

CLINICAL DATA: Intramedullary nail, tibia.

EXAM:
RIGHT TIBIA AND FIBULA - 2 VIEW

[Series 1: run · 9 of 9 slices shown]
[im 1/9]
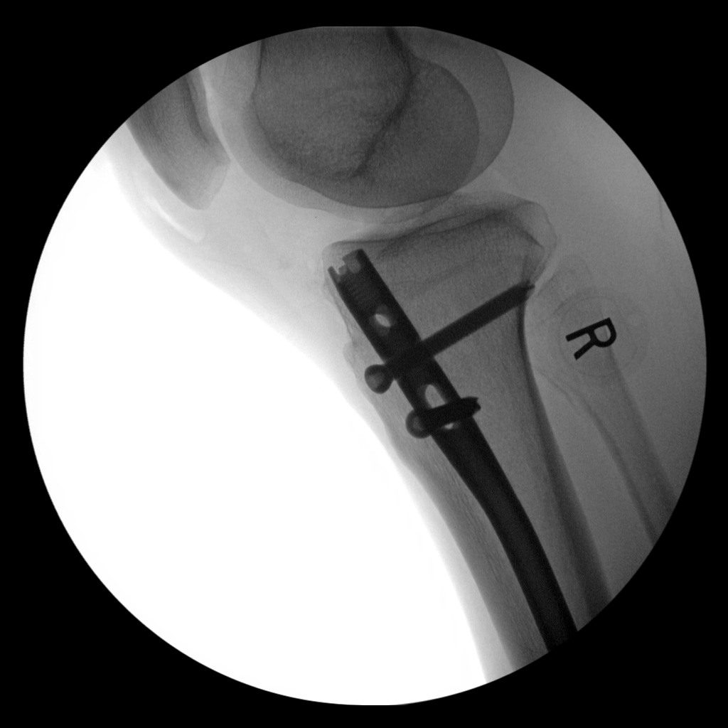
[im 2/9]
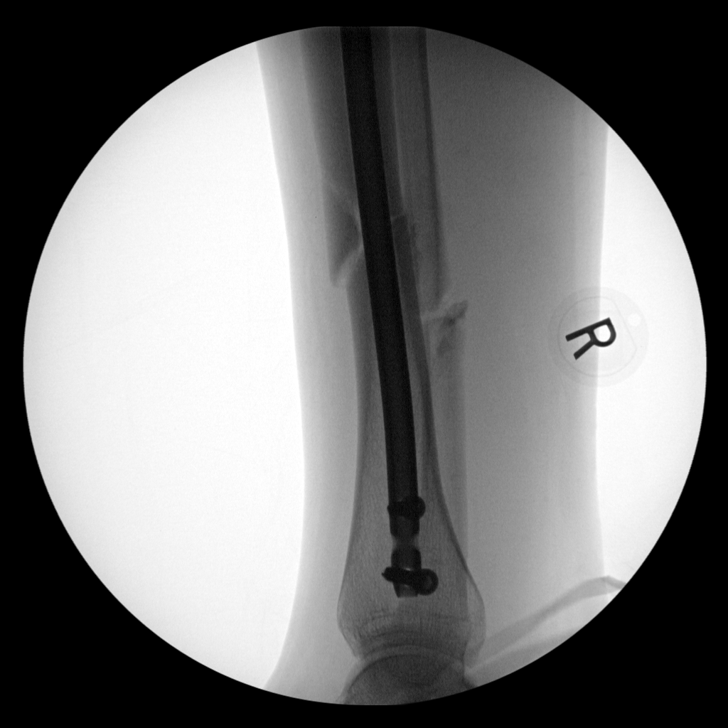
[im 3/9]
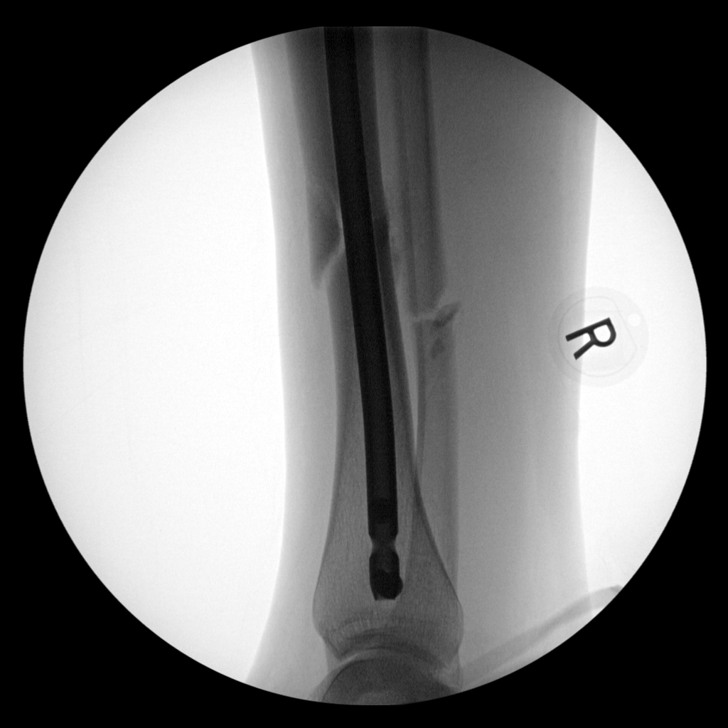
[im 4/9]
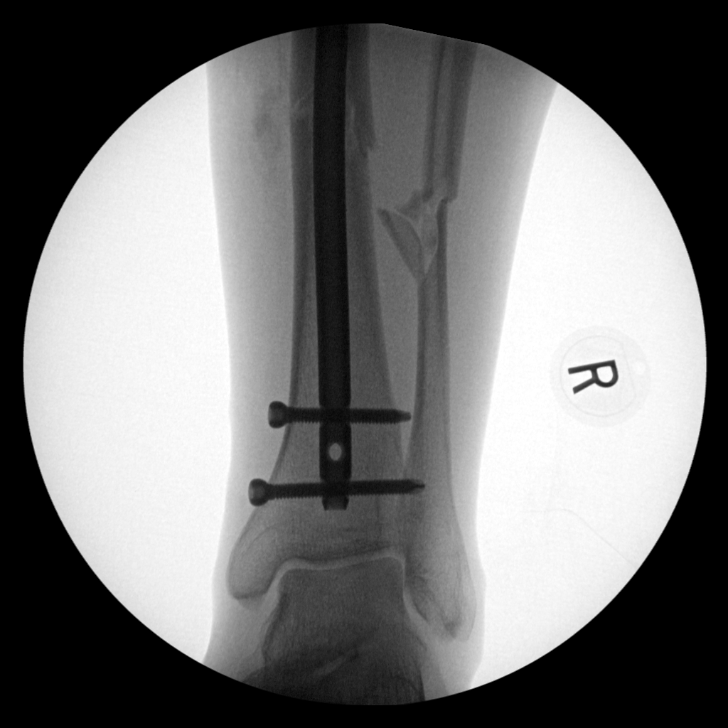
[im 5/9]
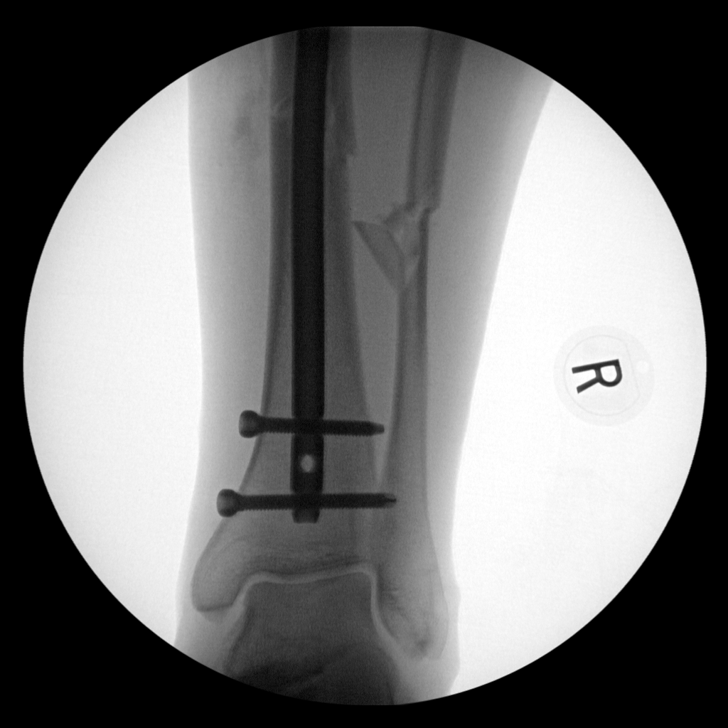
[im 6/9]
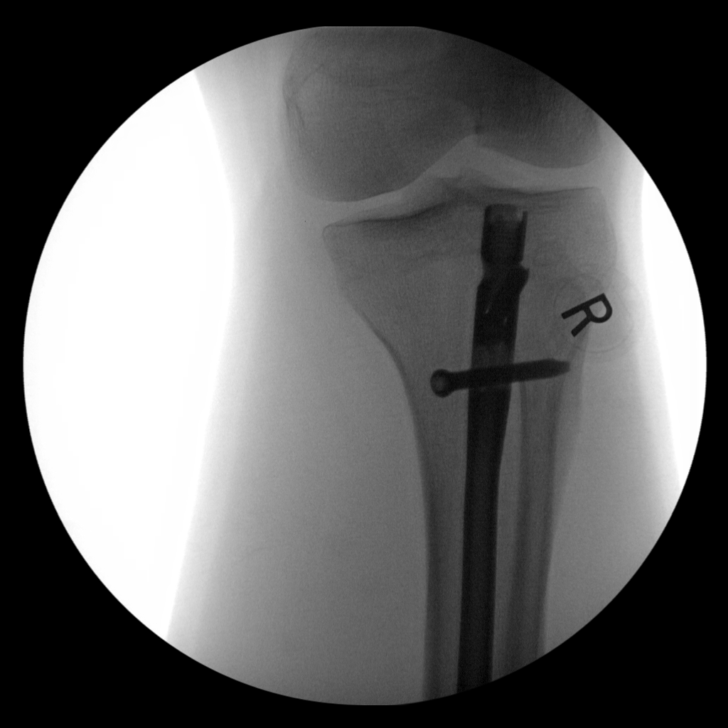
[im 7/9]
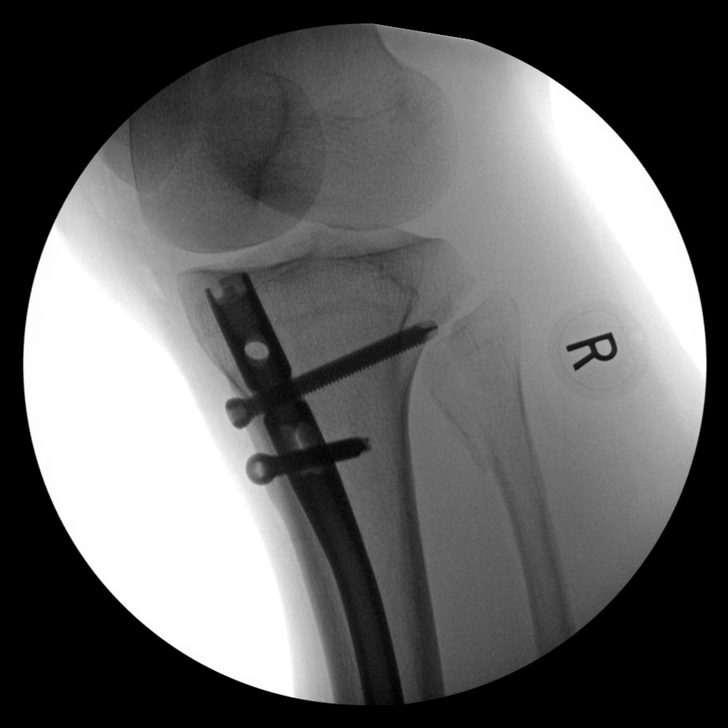
[im 8/9]
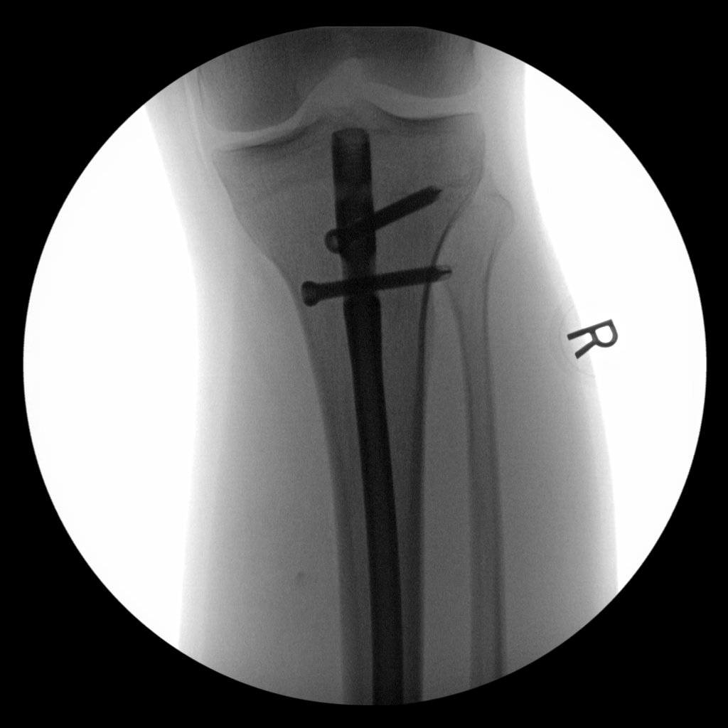
[im 9/9]
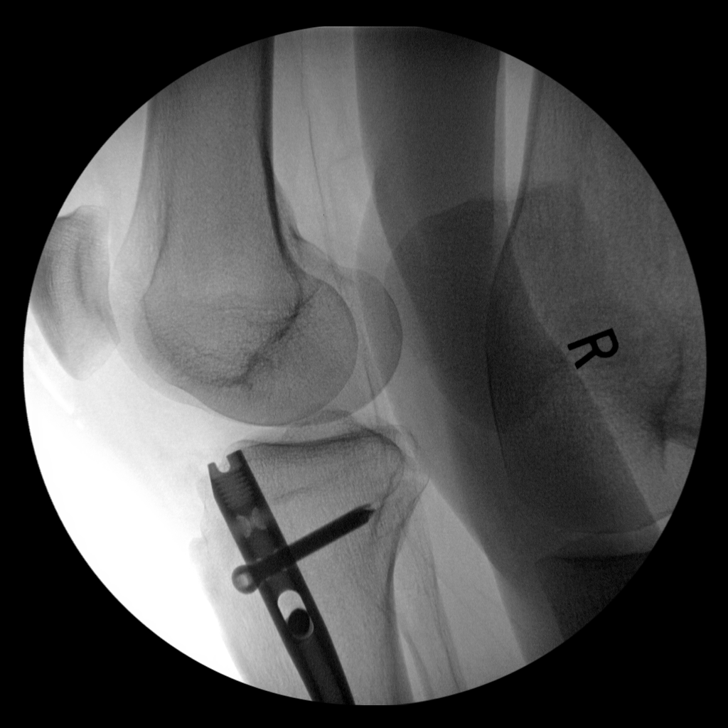

[9 of 9 positions shown; findings below may reference images not displayed]

FINDINGS: Nine fluoroscopic images were obtained intraoperatively. Total
fluoroscopy time is 22.8 seconds. Dose: 0.99 mGy. There is
redemonstration of comminuted fractures of the mid to distal tibia
and fibula with interval placement of intramedullary rod and screws
in the tibia. Please see operative report for additional
information.
IMPRESSION: Intraoperative utilization of fluoroscopy

## 2023-07-06 IMAGING — DX DG TIBIA/FIBULA PORT 2V*R*
4 series · 4 of 4 positions shown · non-contrast
Comparison: 09/03/2021

CLINICAL DATA: Postop

EXAM:
PORTABLE RIGHT TIBIA AND FIBULA - 2 VIEW

[tibia ap (1 of 2)]
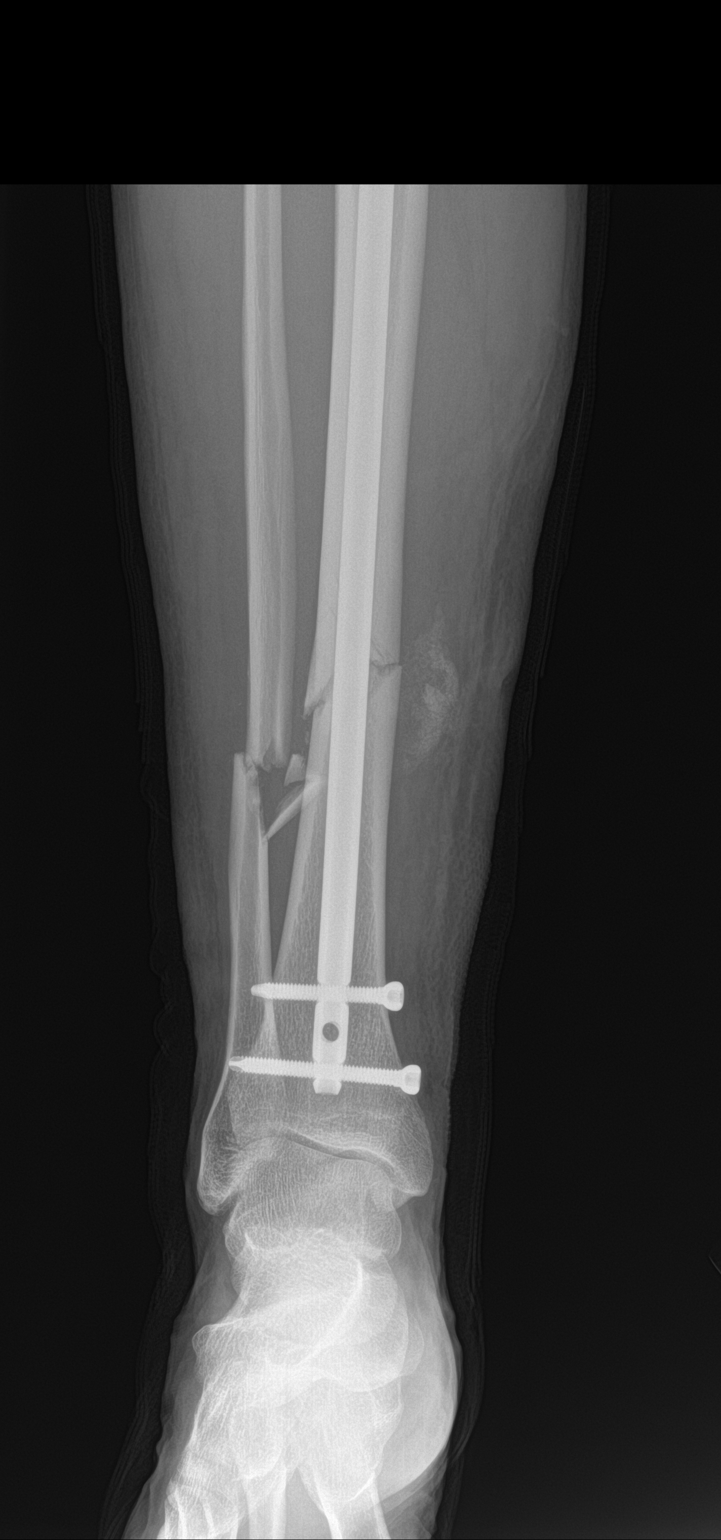

[tibia ap (2 of 2)]
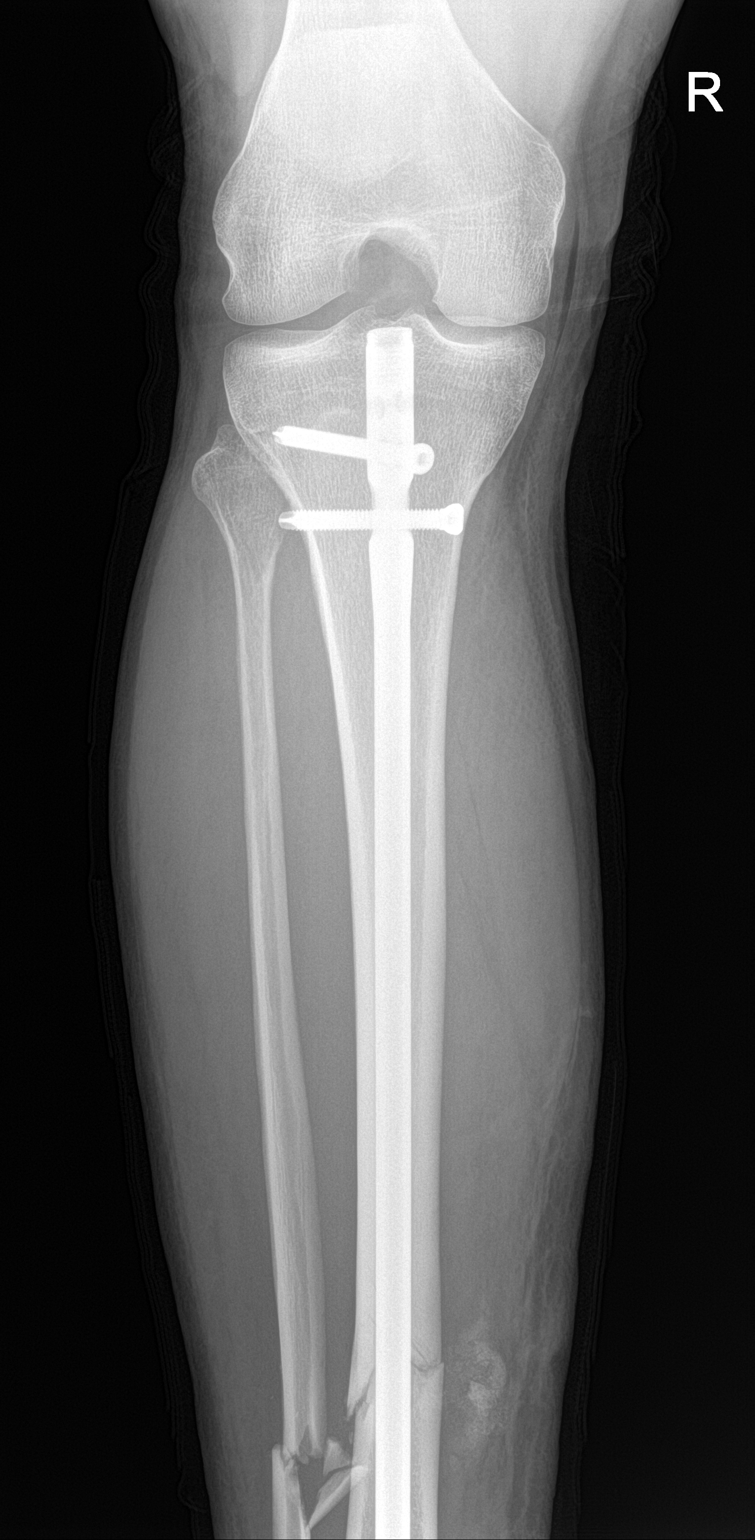

[tibia lat (1 of 2)]
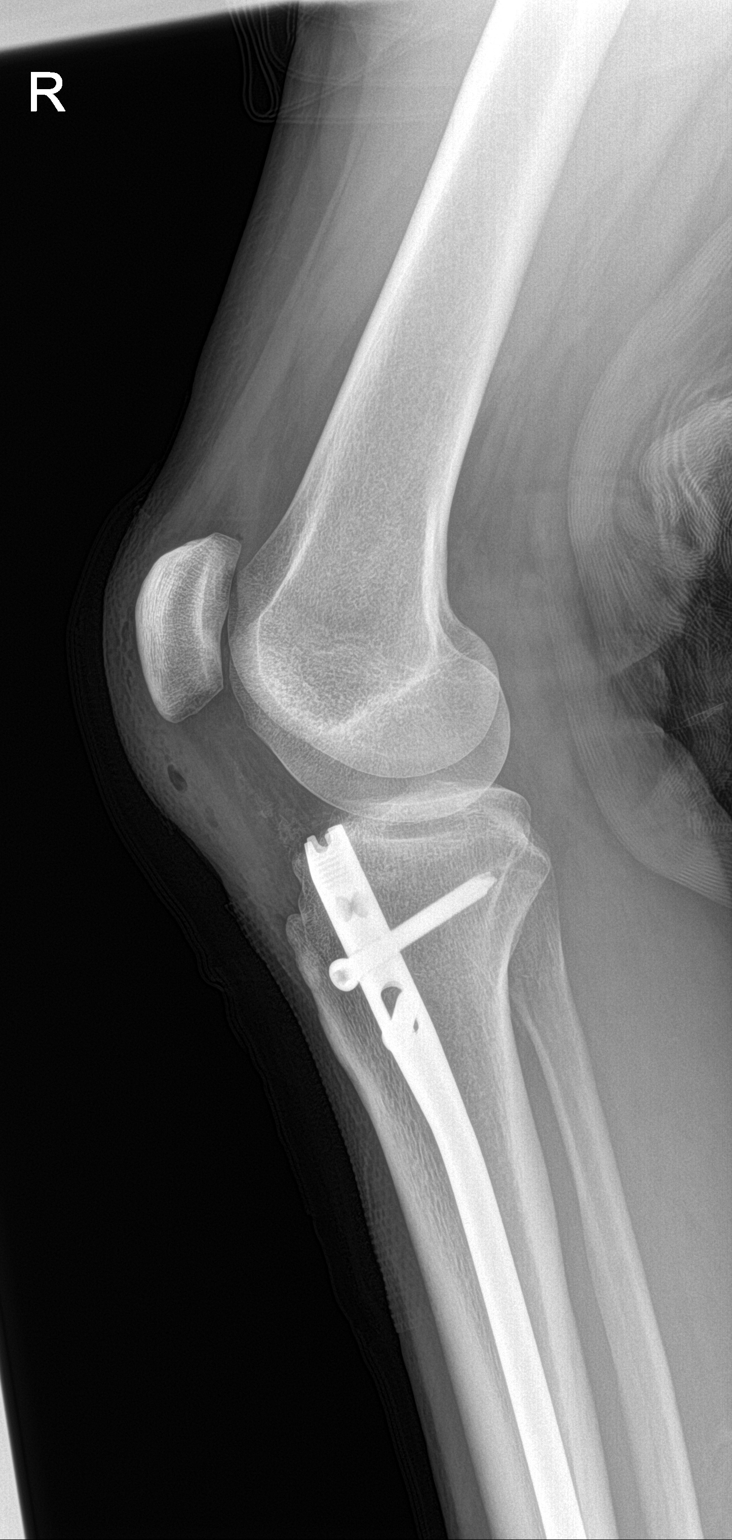

[tibia lat (2 of 2)]
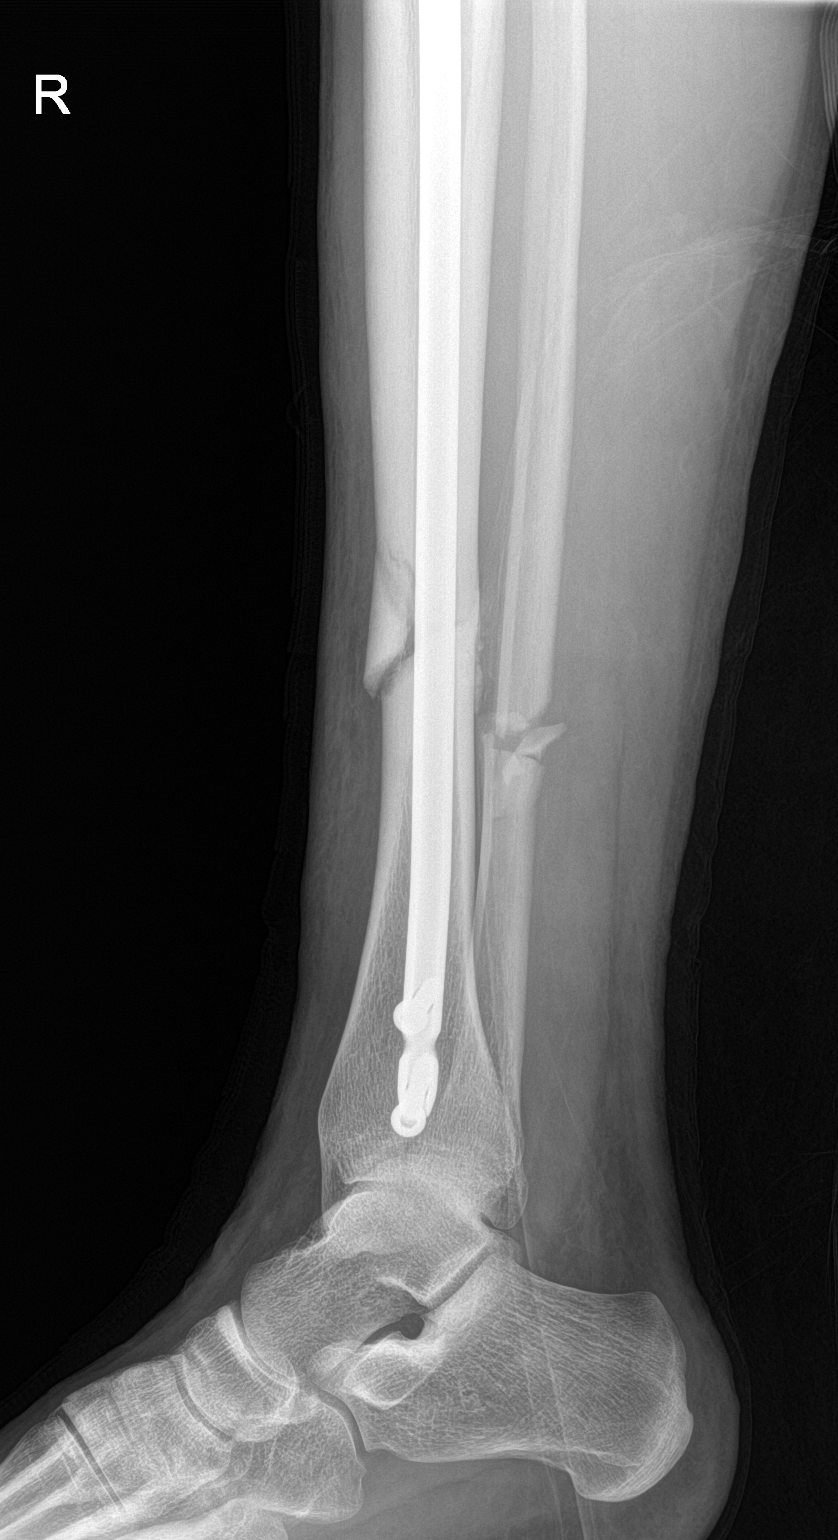

[4 of 4 positions shown; findings below may reference images not displayed]

FINDINGS: Status post placement of intramedullary nail across the tibial
fracture. No hardware complicating feature. Anatomic alignment.
Comminuted displaced fibular fracture again noted with improved
alignment since prior study.
IMPRESSION: Internal fixation with IM nail across the tibial fracture with
anatomic alignment.

Continued mild displacement at the fibular fracture, improved since
prior study.

## 2023-07-07 ENCOUNTER — Encounter: Payer: Self-pay | Admitting: Family Medicine

## 2023-07-07 ENCOUNTER — Ambulatory Visit: Admitting: Family Medicine

## 2023-07-07 VITALS — BP 150/106 | HR 72 | Temp 98.2°F | Resp 18 | Ht 72.0 in | Wt 203.4 lb

## 2023-07-07 DIAGNOSIS — G4486 Cervicogenic headache: Secondary | ICD-10-CM | POA: Diagnosis not present

## 2023-07-07 DIAGNOSIS — I10 Essential (primary) hypertension: Secondary | ICD-10-CM | POA: Diagnosis not present

## 2023-07-07 DIAGNOSIS — N528 Other male erectile dysfunction: Secondary | ICD-10-CM | POA: Diagnosis not present

## 2023-07-07 LAB — URINALYSIS, ROUTINE W REFLEX MICROSCOPIC
Bilirubin Urine: NEGATIVE
Ketones, ur: NEGATIVE
Leukocytes,Ua: NEGATIVE
Nitrite: NEGATIVE
Specific Gravity, Urine: 1.02 (ref 1.000–1.030)
Total Protein, Urine: NEGATIVE
Urine Glucose: NEGATIVE
Urobilinogen, UA: 0.2 (ref 0.0–1.0)
pH: 7 (ref 5.0–8.0)

## 2023-07-07 LAB — MICROALBUMIN / CREATININE URINE RATIO
Creatinine,U: 125.3 mg/dL
Microalb Creat Ratio: 11.6 mg/g (ref 0.0–30.0)
Microalb, Ur: 1.4 mg/dL (ref 0.0–1.9)

## 2023-07-07 MED ORDER — IBUPROFEN 600 MG PO TABS: 600.0000 mg | ORAL_TABLET | Freq: Four times a day (QID) | ORAL | 1 refills | Status: DC | PRN

## 2023-07-07 MED ORDER — VALSARTAN-HYDROCHLOROTHIAZIDE 160-12.5 MG PO TABS: 1.0000 | ORAL_TABLET | Freq: Every day | ORAL | 0 refills | Status: DC

## 2023-07-07 NOTE — Progress Notes (Signed)
 Subjective:     Patient ID: Calvin Simpson, male    DOB: [birth year], 38 y.o.   MRN: [id number]  Chief Complaint  Patient presents with   Follow-up    1 week follow-up on ha, blood pressure     HPI-here w/wife Low sodium in family Discussed the use of AI scribe software for clinical note transcription with the patient, who gave verbal consent to proceed.  History of Present Illness   He presents with persistent headaches and elevated blood pressure.  He has been experiencing elevated blood pressure, with home readings consistently ranging from 140s to 160s systolic and 92 to 111 diastolic. Despite taking amlodipine 5 mg, his blood pressure remains high, with today's reading at 150/106. There has been no significant change in his blood pressure since starting the medication.  He describes persistent headaches as 'blistering and pounding,' which pulse with his heartbeat. Although he managed to work yesterday, the headaches persist. He has been using Tylenol for pain relief, but it is not fully effective. He is considering combining it with ibuprofen for better management.  He reports an episode of erectile dysfunction, with difficulty maintaining an erection, which he attributes to his current health issues and medication. This is causing him stress and concern.  He mentions a family history of low sodium levels, affecting his great-grandmother, grandmother, and mother. He recalls having chest pain in high school attributed to low sodium. No chest pain, shortness of breath, or swelling in his legs.       Health Maintenance Due  Topic Date Due   Hepatitis C Screening  Never done    Past Medical History:  Diagnosis Date   Allergy 1994   Amoxicillin   Closed right fibular fracture 09/05/2021   Fx clavicle    Fx wrist    right    GERD (gastroesophageal reflux disease)     Past Surgical History:  Procedure Laterality Date   FRACTURE SURGERY  May 2023   Right tibia and  fibula   TIBIA IM NAIL INSERTION Right 09/04/2021   Procedure: INTRAMEDULLARY (IM) NAIL TIBIAL;  Surgeon: Myrene Galas, MD;  Location: [facility] OR;  Service: Orthopedics;  Laterality: Right;   TONSILLECTOMY       Current Outpatient Medications:    amLODipine (NORVASC) 5 MG tablet, Take 1 tablet (5 mg total) by mouth daily., Disp: 30 tablet, Rfl: 0   Omeprazole Magnesium (PRILOSEC OTC PO), Take 20 mg by mouth daily., Disp: , Rfl:    valsartan-hydrochlorothiazide (DIOVAN-HCT) 160-12.5 MG tablet, Take 1 tablet by mouth daily., Disp: 30 tablet, Rfl: 0   ibuprofen (ADVIL) 600 MG tablet, Take 1 tablet (600 mg total) by mouth every 6 (six) hours as needed., Disp: 60 tablet, Rfl: 1  Allergies  Allergen Reactions   Amoxicillin Hives   ROS neg/noncontributory except as noted HPI/below      Objective:     BP (!) 150/106   Pulse 72   Temp 98.2 F (36.8 C) (Temporal)   Resp 18   Ht 6' (1.829 m)   Wt 203 lb 6 oz (92.3 kg)   SpO2 98%   BMI 27.58 kg/m  Wt Readings from Last 3 Encounters:  07/07/23 203 lb 6 oz (92.3 kg)  06/30/23 205 lb 4 oz (93.1 kg)  06/28/23 190 lb (86.2 kg)    Physical Exam   Gen: WDWN NAD HEENT: NCAT, conjunctiva not injected, sclera nonicteric CARDIAC: RRR, S1S2+, no murmur.  LUNGS: CTAB. No wheezes ABDOMEN:  BS+,  soft, NTND, No HSM, no masses EXT:  no edema MSK: no gross abnormalities.  NEURO: A&O x3.  CN II-XII intact.  PSYCH: normal mood. Good eye contact  Discussed normal c spine x ray     Assessment & Plan:  Primary hypertension -     Microalbumin / creatinine urine ratio -     Urinalysis, Routine w reflex microscopic  Cervicogenic headache  Other male erectile dysfunction  Other orders -     Valsartan-hydroCHLOROthiazide; Take 1 tablet by mouth daily.  Dispense: 30 tablet; Refill: 0 -     Ibuprofen; Take 1 tablet (600 mg total) by mouth every 6 (six) hours as needed.  Dispense: 60 tablet; Refill: 1  Assessment and Plan    Hypertension    Hypertension remains uncontrolled with amlodipine 5 mg, with blood pressure ranging from 140s to 160s systolic and 92 to 111 diastolic, likely contributing to persistent headaches. Intensifying treatment is expected to alleviate headaches. Discussed potential side effects of antihypertensives, including erectile dysfunction, and emphasized the importance of blood pressure control to prevent complications. Continue amlodipine 5 mg daily and add valsartan with a low-dose diuretic 160/12.5. Check urine for protein to rule out nephropathy and monitor blood pressure at home once daily. Schedule follow-up in 1-2 weeks.  Headache   Persistent headaches are likely secondary to uncontrolled hypertension, affecting daily activities. Better blood pressure control is expected to alleviate headaches. Currently using acetaminophen for pain relief and considering additional options. Discussed alternating acetaminophen with ibuprofen for improved pain control. Continue acetaminophen 1000 mg every 6 hours as needed and add ibuprofen 600 mg every 6 hours, alternating with acetaminophen. Consider prescription for 600 mg ibuprofen tablets.  Erectile Dysfunction   Recent onset of erectile dysfunction may be related to uncontrolled hypertension or as a side effect of antihypertensives. Discussed potential for medications to cause erectile dysfunction and the importance of blood pressure control to prevent complications. Advised to monitor erectile function and report any persistent issues. Consider medication for erectile dysfunction if problems persist.  General Health Maintenance   Sodium levels are on the low end of normal, relevant due to family history of hyponatremia. This will be considered in ongoing management. Monitor sodium levels in future blood work.        Return in about 1 week (around 07/14/2023) for HTN.  Angelena Sole, MD

## 2023-07-07 NOTE — Progress Notes (Signed)
 No protein in urine, but dose have small amount of blood.  Order CT abd/pelvis w/contrast for microscopic hematuria

## 2023-07-07 NOTE — Patient Instructions (Signed)
 It was very nice to see you today!  Ibuprofen 600mg  every 6 hrs Tylenol 1000mg  every 6 hrs   PLEASE NOTE:  If you had any lab tests please let us know if you have not heard back within a few days. You may see your results on MyChart before we have a chance to review them but we will give you a call once they are reviewed by [place]. If we ordered any referrals today, please let us know if you have not heard from their office within the next week.   Please try these tips to maintain a healthy lifestyle:  Eat most of your calories during the day when you are active. Eliminate processed foods including packaged sweets (pies, cakes, cookies), reduce intake of potatoes, white bread, white pasta, and white rice. Look for whole grain options, oat flour or almond flour.  Each meal should contain half fruits/vegetables, one quarter protein, and one quarter carbs (no bigger than a computer mouse).  Cut down on sweet beverages. This includes juice, soda, and sweet tea. Also watch fruit intake, though this is a healthier sweet option, it still contains natural sugar! Limit to 3 servings daily.  Drink at least 1 glass of water with each meal and aim for at least 8 glasses per day  Exercise at least 150 minutes every week.

## 2023-07-14 ENCOUNTER — Encounter: Payer: Self-pay | Admitting: Family Medicine

## 2023-07-14 ENCOUNTER — Ambulatory Visit: Admitting: Family Medicine

## 2023-07-14 VITALS — BP 122/82 | HR 84 | Temp 98.1°F | Resp 18 | Ht 72.0 in | Wt 205.1 lb

## 2023-07-14 DIAGNOSIS — I10 Essential (primary) hypertension: Secondary | ICD-10-CM

## 2023-07-14 DIAGNOSIS — F4329 Adjustment disorder with other symptoms: Secondary | ICD-10-CM | POA: Diagnosis not present

## 2023-07-14 DIAGNOSIS — N528 Other male erectile dysfunction: Secondary | ICD-10-CM | POA: Insufficient documentation

## 2023-07-14 LAB — COMPREHENSIVE METABOLIC PANEL
ALT: 65 U/L — ABNORMAL HIGH (ref 0–53)
AST: 30 U/L (ref 0–37)
Albumin: 4.5 g/dL (ref 3.5–5.2)
Alkaline Phosphatase: 61 U/L (ref 39–117)
BUN: 13 mg/dL (ref 6–23)
CO2: 29 meq/L (ref 19–32)
Calcium: 9.9 mg/dL (ref 8.4–10.5)
Chloride: 99 meq/L (ref 96–112)
Creatinine, Ser: 1.11 mg/dL (ref 0.40–1.50)
GFR: 86.2 mL/min (ref >60.00)
Glucose, Bld: 82 mg/dL (ref 70–99)
Potassium: 4.3 meq/L (ref 3.5–5.1)
Sodium: 136 meq/L (ref 135–145)
Total Bilirubin: 0.6 mg/dL (ref 0.2–1.2)
Total Protein: 7.3 g/dL (ref 6.0–8.3)

## 2023-07-14 LAB — LIPID PANEL
Cholesterol: 287 mg/dL — ABNORMAL HIGH (ref 0–200)
HDL: 39.8 mg/dL (ref >39.00)
LDL Cholesterol: 177 mg/dL — ABNORMAL HIGH (ref 0–99)
NonHDL: 247.2
Total CHOL/HDL Ratio: 7
Triglycerides: 353 mg/dL — ABNORMAL HIGH (ref 0.0–149.0)
VLDL: 70.6 mg/dL — ABNORMAL HIGH (ref 0.0–40.0)

## 2023-07-14 LAB — TSH: TSH: 1.4 u[IU]/mL (ref 0.35–5.50)

## 2023-07-14 MED ORDER — VALSARTAN-HYDROCHLOROTHIAZIDE 320-25 MG PO TABS: 1.0000 | ORAL_TABLET | Freq: Every day | ORAL | 0 refills | Status: DC

## 2023-07-14 MED ORDER — AMLODIPINE BESYLATE 5 MG PO TABS: 5.0000 mg | ORAL_TABLET | Freq: Every day | ORAL | 0 refills | Status: DC

## 2023-07-14 MED ORDER — SERTRALINE HCL 50 MG PO TABS: 50.0000 mg | ORAL_TABLET | Freq: Every day | ORAL | 1 refills | Status: DC

## 2023-07-14 MED ORDER — TADALAFIL 20 MG PO TABS: 10.0000 mg | ORAL_TABLET | Freq: Every day | ORAL | 3 refills | Status: DC | PRN

## 2023-07-14 NOTE — Patient Instructions (Signed)
 It was very nice to see you today!  Increase valsartan hct to 320/25mg   Sertraline 1/2 tab per day for 1 wk then whole tab daily   PLEASE NOTE:  If you had any lab tests please let us know if you have not heard back within a few days. You may see your results on MyChart before we have a chance to review them but we will give you a call once they are reviewed by [place]. If we ordered any referrals today, please let us know if you have not heard from their office within the next week.   Please try these tips to maintain a healthy lifestyle:  Eat most of your calories during the day when you are active. Eliminate processed foods including packaged sweets (pies, cakes, cookies), reduce intake of potatoes, white bread, white pasta, and white rice. Look for whole grain options, oat flour or almond flour.  Each meal should contain half fruits/vegetables, one quarter protein, and one quarter carbs (no bigger than a computer mouse).  Cut down on sweet beverages. This includes juice, soda, and sweet tea. Also watch fruit intake, though this is a healthier sweet option, it still contains natural sugar! Limit to 3 servings daily.  Drink at least 1 glass of water with each meal and aim for at least 8 glasses per day  Exercise at least 150 minutes every week.

## 2023-07-14 NOTE — Progress Notes (Signed)
 Kidney function stable.  Liver test slightly elevated.  See if lab can add hep C to blood drawn..  could be fatty liver.   Your cholesterol levels are elevated.  Work on low cholesterol and lower carbs/sugars diet and  get exercise to try to lower your cholesterol.   If lab can't add the hep C, then order that, lft, A1C to be done in 1-2 months

## 2023-07-14 NOTE — Progress Notes (Signed)
 Subjective:     Patient ID: Calvin Simpson, male    DOB: [birth year], 38 y.o.   MRN: [id number]  Chief Complaint  Patient presents with   Hypertension    1 week follow-up on htn Discuss recent results and treatment options concerning CT:  That was of his head.  Or we can send him to a urologist    HPI Discussed the use of AI scribe software for clinical note transcription with the patient, who gave verbal consent to proceed.  History of Present Illness   Calvin Simpson is a 38 year old male with hypertension who presents with persistent headaches and stress management issues. He is accompanied by his wife, who provides additional context about his stressors and lifestyle.  He experiences persistent headaches but have greatly improved, described as 'completely doable' but still present. The headaches fluctuate in intensity, often worsening in the morning and improving with activity. He has not taken any ibuprofen or acetaminophen today, indicating some improvement, though the headaches remain.  He manages hypertension with valsartan HCT 160/12.5 mg and amlodipine 5 mg. Home blood pressure readings range from 127/88 to 137/93, with occasional spikes, such as 143/104 before starting the second medication. His pulse is in the 70s to 80s. He feels his heartbeat and associates it with temper flares. He has a history of high blood pressure and is concerned about its management.  He experiences significant stress and anger management issues, attributed to work stress and personal life challenges, including ongoing legal issues with the mother of his two children. His work environment is high-pressure and male-dominated, contributing to his stress. He manages anger by stepping away from situations and using breathing techniques. He notes his temper has improved at his current job compared to previous ones.  He has noticed erectile dysfunction, specifically difficulty with initial arousal, which he  associates with his blood pressure medication.  There is a history of hematuria since childhood. Previous tests did not reveal significant issues. He has been exposed to solvents and chemicals at work, which could be a contributing factor, but he has not experienced any pain that might suggest nephrolithiasis.       Health Maintenance Due  Topic Date Due   Hepatitis C Screening  Never done    Past Medical History:  Diagnosis Date   Allergy 1994   Amoxicillin   Closed right fibular fracture 09/05/2021   Fx clavicle    Fx wrist    right    GERD (gastroesophageal reflux disease)     Past Surgical History:  Procedure Laterality Date   FRACTURE SURGERY  May 2023   Right tibia and fibula   TIBIA IM NAIL INSERTION Right 09/04/2021   Procedure: INTRAMEDULLARY (IM) NAIL TIBIAL;  Surgeon: Myrene Galas, MD;  Location: [facility] OR;  Service: Orthopedics;  Laterality: Right;   TONSILLECTOMY       Current Outpatient Medications:    ibuprofen (ADVIL) 600 MG tablet, Take 1 tablet (600 mg total) by mouth every 6 (six) hours as needed., Disp: 60 tablet, Rfl: 1   Omeprazole Magnesium (PRILOSEC OTC PO), Take 20 mg by mouth daily., Disp: , Rfl:    sertraline (ZOLOFT) 50 MG tablet, Take 1 tablet (50 mg total) by mouth daily., Disp: 30 tablet, Rfl: 1   tadalafil (CIALIS) 20 MG tablet, Take 0.5-1 tablets (10-20 mg total) by mouth daily as needed for erectile dysfunction., Disp: 10 tablet, Rfl: 3   valsartan-hydrochlorothiazide (DIOVAN-HCT) 320-25 MG tablet, Take 1 tablet  by mouth daily., Disp: 90 tablet, Rfl: 0   amLODipine (NORVASC) 5 MG tablet, Take 1 tablet (5 mg total) by mouth daily., Disp: 90 tablet, Rfl: 0  Allergies  Allergen Reactions   Amoxicillin Hives   ROS neg/noncontributory except as noted HPI/below      Objective:     BP 122/82 (BP Location: Right Arm, Patient Position: Sitting, Cuff Size: Large)   Pulse 84   Temp 98.1 F (36.7 C) (Temporal)   Resp 18   Ht 6' (1.829 m)    Wt 205 lb 2 oz (93 kg)   SpO2 97%   BMI 27.82 kg/m  Wt Readings from Last 3 Encounters:  07/14/23 205 lb 2 oz (93 kg)  07/07/23 203 lb 6 oz (92.3 kg)  06/30/23 205 lb 4 oz (93.1 kg)    Physical Exam   Gen: WDWN NAD HEENT: NCAT, conjunctiva not injected, sclera nonicteric CARDIAC: RRR, S1S2+, no murmur.  MSK: no gross abnormalities.  NEURO: A&O x3.  CN II-XII intact.  PSYCH: normal mood. Good eye contact     Assessment & Plan:  Primary hypertension -     amLODIPine Besylate; Take 1 tablet (5 mg total) by mouth daily.  Dispense: 90 tablet; Refill: 0 -     Valsartan-hydroCHLOROthiazide; Take 1 tablet by mouth daily.  Dispense: 90 tablet; Refill: 0 -     Comprehensive metabolic panel -     TSH -     Lipid panel  Other male erectile dysfunction -     Tadalafil; Take 0.5-1 tablets (10-20 mg total) by mouth daily as needed for erectile dysfunction.  Dispense: 10 tablet; Refill: 3  Adjustment disorder with disturbance of emotion  Other orders -     Sertraline HCl; Take 1 tablet (50 mg total) by mouth daily.  Dispense: 30 tablet; Refill: 1  Assessment and Plan    Hypertension   Hypertension remains suboptimally controlled despite valsartan HCT 160/12.5 mg and amlodipine 5 mg. Blood pressure readings have improved but remain elevated at 127-137/88-93 mmHg, previously 143/104 mmHg before adding valsartan/HCT. Stress and anger may contribute to elevated levels. Increase valsartan/hct to 320/25 dosage, continue amlodipine 5 mg, and monitor blood pressure regularly. Communicate any symptoms of dizziness or hypotension. send results/problems  Headaches   Headaches have improved but persist intermittently, associated with stress and elevated blood pressure. He has not required ibuprofen or acetaminophen recently.  Anger and Stress Management   Significant stress and anger issues are exacerbated by work and personal life stressors. Current coping strategies include breathing exercises and  breaks. Discussed mood medications with potential side effects such as decreased libido, delayed orgasm, and rare retrograde ejaculation. He is hesitant to start mood medications and prefers to focus on blood pressure management first. Counseling was discussed as an alternative-declines. Consider zoloft 50mg .  SED.  f/u 1 mo  Erectile Dysfunction   Erectile dysfunction is likely related to hypertension and its treatment. Discussed management options, including Viagra or Cialis, with concerns about planning and side effects such as mild headache, dizziness, and rare priapism. Explained Viagra's 4-hour window and Cialis's 36-hour window, with a daily low-dose option to avoid planning. Prescribe as-needed dose of erectile dysfunction medication for use during anniversary trip. Discuss potential side effects and need for planning. cialis 20mg  tab  Hematuria   Intermittent hematuria has been present since childhood. Differential diagnosis includes benign kidney filtration, kidney stones, or more serious conditions. No current pain or symptoms suggestive of kidney stones. Previous evaluations have  not identified a cause. Discussed CT scan to evaluate kidneys and adrenal glands, but he prefers to wait due to potential costs and insurance considerations. Consider CT scan of abdomen to evaluate kidneys and adrenal glands if symptoms persist or worsen.  suspect benign as present since childhood  General Health Maintenance   He has not had recent cholesterol or general health screenings. A blood test is planned to monitor effects of diuretic and assess general health parameters. Order blood test to monitor effects of diuretic and assess general health parameters.  Follow-up   Follow-up is necessary to monitor blood pressure, mood, and erectile dysfunction management. Communication is encouraged to adjust treatment as needed. Schedule follow-up in one month via video to assess mood and blood pressure management.  Communicate any changes in symptoms or blood pressure readings.        Return in about 4 weeks (around 08/11/2023) for mood.  Angelena Sole, MD

## 2023-07-15 ENCOUNTER — Other Ambulatory Visit: Payer: Self-pay | Admitting: *Deleted

## 2023-07-15 DIAGNOSIS — R7989 Other specified abnormal findings of blood chemistry: Secondary | ICD-10-CM

## 2023-07-18 ENCOUNTER — Emergency Department (HOSPITAL_COMMUNITY)

## 2023-07-18 ENCOUNTER — Encounter (HOSPITAL_COMMUNITY)
Admission: EM | Disposition: A | Discharge status: Home / Self Care | Payer: Self-pay | Source: Home / Self Care | Attending: Emergency Medicine

## 2023-07-18 ENCOUNTER — Encounter (HOSPITAL_COMMUNITY): Payer: Self-pay | Admitting: Emergency Medicine

## 2023-07-18 ENCOUNTER — Other Ambulatory Visit: Payer: Self-pay

## 2023-07-18 ENCOUNTER — Observation Stay (HOSPITAL_COMMUNITY)
Admission: EM | Admit: 2023-07-18 | Discharge: 2023-07-19 | Disposition: A | Discharge status: Home / Self Care | Attending: General Surgery | Admitting: General Surgery

## 2023-07-18 ENCOUNTER — Emergency Department (HOSPITAL_COMMUNITY): Admitting: Anesthesiology

## 2023-07-18 DIAGNOSIS — K3580 Unspecified acute appendicitis: Secondary | ICD-10-CM | POA: Diagnosis not present

## 2023-07-18 DIAGNOSIS — K353 Acute appendicitis with localized peritonitis, without perforation or gangrene: Secondary | ICD-10-CM | POA: Diagnosis not present

## 2023-07-18 DIAGNOSIS — R1013 Epigastric pain: Secondary | ICD-10-CM | POA: Diagnosis not present

## 2023-07-18 DIAGNOSIS — N200 Calculus of kidney: Secondary | ICD-10-CM | POA: Diagnosis not present

## 2023-07-18 DIAGNOSIS — K5710 Diverticulosis of small intestine without perforation or abscess without bleeding: Secondary | ICD-10-CM | POA: Diagnosis not present

## 2023-07-18 DIAGNOSIS — K3532 Acute appendicitis with perforation and localized peritonitis, without abscess: Secondary | ICD-10-CM | POA: Diagnosis not present

## 2023-07-18 DIAGNOSIS — Z87891 Personal history of nicotine dependence: Secondary | ICD-10-CM | POA: Diagnosis not present

## 2023-07-18 DIAGNOSIS — Z79899 Other long term (current) drug therapy: Secondary | ICD-10-CM | POA: Diagnosis not present

## 2023-07-18 DIAGNOSIS — R1031 Right lower quadrant pain: Secondary | ICD-10-CM | POA: Diagnosis not present

## 2023-07-18 HISTORY — PX: XI ROBOTIC LAPAROSCOPIC ASSISTED APPENDECTOMY: SHX6877

## 2023-07-18 LAB — COMPREHENSIVE METABOLIC PANEL
ALT: 55 U/L — ABNORMAL HIGH (ref 0–44)
AST: 26 U/L (ref 15–41)
Albumin: 4.1 g/dL (ref 3.5–5.0)
Alkaline Phosphatase: 60 U/L (ref 38–126)
Anion gap: 10 (ref 5–15)
BUN: 13 mg/dL (ref 6–20)
CO2: 24 mmol/L (ref 22–32)
Calcium: 9.6 mg/dL (ref 8.9–10.3)
Chloride: 96 mmol/L — ABNORMAL LOW (ref 98–111)
Creatinine, Ser: 1.25 mg/dL — ABNORMAL HIGH (ref 0.61–1.24)
GFR, Estimated: >60 mL/min (ref >60)
Glucose, Bld: 118 mg/dL — ABNORMAL HIGH (ref 70–99)
Potassium: 4 mmol/L (ref 3.5–5.1)
Sodium: 130 mmol/L — ABNORMAL LOW (ref 135–145)
Total Bilirubin: 1.7 mg/dL — ABNORMAL HIGH (ref 0.0–1.2)
Total Protein: 8.2 g/dL — ABNORMAL HIGH (ref 6.5–8.1)

## 2023-07-18 LAB — URINALYSIS, ROUTINE W REFLEX MICROSCOPIC
Bacteria, UA: NONE SEEN
Bilirubin Urine: NEGATIVE
Glucose, UA: NEGATIVE mg/dL
Ketones, ur: NEGATIVE mg/dL
Leukocytes,Ua: NEGATIVE
Nitrite: NEGATIVE
Protein, ur: NEGATIVE mg/dL
Specific Gravity, Urine: 1.009 (ref 1.005–1.030)
pH: 7 (ref 5.0–8.0)

## 2023-07-18 LAB — CBC
HCT: 46.6 % (ref 39.0–52.0)
Hemoglobin: 16.1 g/dL (ref 13.0–17.0)
MCH: 31.8 pg (ref 26.0–34.0)
MCHC: 34.5 g/dL (ref 30.0–36.0)
MCV: 91.9 fL (ref 80.0–100.0)
Platelets: 293 10*3/uL (ref 150–400)
RBC: 5.07 MIL/uL (ref 4.22–5.81)
RDW: 12.6 % (ref 11.5–15.5)
WBC: 14.2 10*3/uL — ABNORMAL HIGH (ref 4.0–10.5)
nRBC: 0 % (ref 0.0–0.2)

## 2023-07-18 LAB — LIPASE, BLOOD: Lipase: 27 U/L (ref 11–51)

## 2023-07-18 SURGERY — APPENDECTOMY, ROBOT-ASSISTED, LAPAROSCOPIC
Anesthesia: General | Site: Abdomen

## 2023-07-18 MED ORDER — OXYCODONE HCL 5 MG/5ML PO SOLN: 5.0000 mg | Freq: Once | ORAL | Status: DC | PRN

## 2023-07-18 MED ORDER — IRBESARTAN 150 MG PO TABS
300.0000 mg | ORAL_TABLET | Freq: Every day | ORAL | Status: DC
  Administered 2023-07-19: 300 mg via ORAL
  Filled 2023-07-18: qty 2

## 2023-07-18 MED ORDER — IOHEXOL 300 MG/ML  SOLN
100.0000 mL | Freq: Once | INTRAMUSCULAR | Status: AC | PRN
  Administered 2023-07-18: 100 mL via INTRAVENOUS

## 2023-07-18 MED ORDER — KETOROLAC TROMETHAMINE 30 MG/ML IJ SOLN
INTRAMUSCULAR | Status: DC | PRN
  Administered 2023-07-18: 30 mg via INTRAVENOUS

## 2023-07-18 MED ORDER — BUPIVACAINE HCL (PF) 0.5 % IJ SOLN
INTRAMUSCULAR | Status: AC
  Filled 2023-07-18: qty 30

## 2023-07-18 MED ORDER — AMLODIPINE BESYLATE 5 MG PO TABS
5.0000 mg | ORAL_TABLET | Freq: Every day | ORAL | Status: DC
  Administered 2023-07-18 – 2023-07-19 (×2): 5 mg via ORAL
  Filled 2023-07-18 (×2): qty 1

## 2023-07-18 MED ORDER — LACTATED RINGERS IV SOLN: INTRAVENOUS | Status: DC

## 2023-07-18 MED ORDER — DIPHENHYDRAMINE HCL 12.5 MG/5ML PO ELIX: 12.5000 mg | ORAL_SOLUTION | Freq: Four times a day (QID) | ORAL | Status: DC | PRN

## 2023-07-18 MED ORDER — HYDROMORPHONE HCL 1 MG/ML IJ SOLN: 1.0000 mg | INTRAMUSCULAR | Status: DC | PRN

## 2023-07-18 MED ORDER — PROPOFOL 10 MG/ML IV BOLUS
INTRAVENOUS | Status: AC
  Filled 2023-07-18: qty 20

## 2023-07-18 MED ORDER — ORAL CARE MOUTH RINSE: 15.0000 mL | Freq: Once | OROMUCOSAL | Status: DC

## 2023-07-18 MED ORDER — OXYCODONE HCL 5 MG PO TABS: 5.0000 mg | ORAL_TABLET | Freq: Once | ORAL | Status: DC | PRN

## 2023-07-18 MED ORDER — SUGAMMADEX SODIUM 200 MG/2ML IV SOLN
INTRAVENOUS | Status: DC | PRN
  Administered 2023-07-18: 400 mg via INTRAVENOUS

## 2023-07-18 MED ORDER — SODIUM CHLORIDE 0.9 % IV SOLN: 2.0000 g | INTRAVENOUS | Status: DC

## 2023-07-18 MED ORDER — SIMETHICONE 80 MG PO CHEW: 40.0000 mg | CHEWABLE_TABLET | Freq: Four times a day (QID) | ORAL | Status: DC | PRN

## 2023-07-18 MED ORDER — ONDANSETRON HCL 4 MG/2ML IJ SOLN: 4.0000 mg | Freq: Four times a day (QID) | INTRAMUSCULAR | Status: DC | PRN

## 2023-07-18 MED ORDER — HYDROMORPHONE HCL 1 MG/ML IJ SOLN
1.0000 mg | INTRAMUSCULAR | Status: AC | PRN
  Administered 2023-07-18 (×2): 1 mg via INTRAVENOUS
  Filled 2023-07-18 (×2): qty 1

## 2023-07-18 MED ORDER — ACETAMINOPHEN 500 MG PO TABS
1000.0000 mg | ORAL_TABLET | Freq: Four times a day (QID) | ORAL | Status: DC
  Administered 2023-07-18 – 2023-07-19 (×3): 1000 mg via ORAL
  Filled 2023-07-18 (×3): qty 2

## 2023-07-18 MED ORDER — SEVOFLURANE IN SOLN
RESPIRATORY_TRACT | Status: AC
  Filled 2023-07-18: qty 250

## 2023-07-18 MED ORDER — SODIUM CHLORIDE 0.9 % IV SOLN
2.0000 g | Freq: Three times a day (TID) | INTRAVENOUS | Status: DC
  Administered 2023-07-18 – 2023-07-19 (×2): 2 g via INTRAVENOUS
  Filled 2023-07-18 (×2): qty 12.5

## 2023-07-18 MED ORDER — SODIUM CHLORIDE 0.9 % IV SOLN: Freq: Once | INTRAVENOUS | Status: AC

## 2023-07-18 MED ORDER — SODIUM CHLORIDE 0.9 % IV SOLN
INTRAVENOUS | Status: DC | PRN
  Administered 2023-07-18: 2 g via INTRAVENOUS

## 2023-07-18 MED ORDER — ROCURONIUM BROMIDE 10 MG/ML (PF) SYRINGE
PREFILLED_SYRINGE | INTRAVENOUS | Status: DC | PRN
  Administered 2023-07-18: 20 mg via INTRAVENOUS
  Administered 2023-07-18: 70 mg via INTRAVENOUS

## 2023-07-18 MED ORDER — METHOCARBAMOL 500 MG PO TABS
500.0000 mg | ORAL_TABLET | Freq: Four times a day (QID) | ORAL | Status: DC | PRN
  Administered 2023-07-18 – 2023-07-19 (×2): 500 mg via ORAL
  Filled 2023-07-18 (×2): qty 1

## 2023-07-18 MED ORDER — MORPHINE SULFATE (PF) 4 MG/ML IV SOLN
4.0000 mg | Freq: Once | INTRAVENOUS | Status: AC
  Administered 2023-07-18: 4 mg via INTRAVENOUS
  Filled 2023-07-18: qty 1

## 2023-07-18 MED ORDER — DEXAMETHASONE SODIUM PHOSPHATE 10 MG/ML IJ SOLN
INTRAMUSCULAR | Status: AC
  Filled 2023-07-18: qty 1

## 2023-07-18 MED ORDER — DIPHENHYDRAMINE HCL 50 MG/ML IJ SOLN: 12.5000 mg | Freq: Four times a day (QID) | INTRAMUSCULAR | Status: DC | PRN

## 2023-07-18 MED ORDER — METRONIDAZOLE 500 MG/100ML IV SOLN
500.0000 mg | Freq: Two times a day (BID) | INTRAVENOUS | Status: DC
  Administered 2023-07-18 – 2023-07-19 (×2): 500 mg via INTRAVENOUS
  Filled 2023-07-18 (×2): qty 100

## 2023-07-18 MED ORDER — ROCURONIUM BROMIDE 10 MG/ML (PF) SYRINGE
PREFILLED_SYRINGE | INTRAVENOUS | Status: AC
  Filled 2023-07-18: qty 10

## 2023-07-18 MED ORDER — ZOLPIDEM TARTRATE 5 MG PO TABS: 5.0000 mg | ORAL_TABLET | Freq: Every evening | ORAL | Status: DC | PRN

## 2023-07-18 MED ORDER — LACTATED RINGERS IV BOLUS
1000.0000 mL | Freq: Once | INTRAVENOUS | Status: AC
  Administered 2023-07-18: 1000 mL via INTRAVENOUS

## 2023-07-18 MED ORDER — FENTANYL CITRATE (PF) 100 MCG/2ML IJ SOLN
INTRAMUSCULAR | Status: DC | PRN
  Administered 2023-07-18: 100 ug via INTRAVENOUS
  Administered 2023-07-18 (×2): 50 ug via INTRAVENOUS
  Administered 2023-07-18: 100 ug via INTRAVENOUS

## 2023-07-18 MED ORDER — SUGAMMADEX SODIUM 200 MG/2ML IV SOLN
INTRAVENOUS | Status: AC
  Filled 2023-07-18: qty 2

## 2023-07-18 MED ORDER — BUPIVACAINE HCL (PF) 0.5 % IJ SOLN
INTRAMUSCULAR | Status: DC | PRN
  Administered 2023-07-18: 30 mL

## 2023-07-18 MED ORDER — CHLORHEXIDINE GLUCONATE 0.12 % MT SOLN
15.0000 mL | Freq: Once | OROMUCOSAL | Status: DC
Start: 2023-07-18 — End: 2023-07-18

## 2023-07-18 MED ORDER — SODIUM CHLORIDE 0.9 % IV BOLUS
1000.0000 mL | Freq: Once | INTRAVENOUS | Status: AC
  Administered 2023-07-18: 1000 mL via INTRAVENOUS

## 2023-07-18 MED ORDER — LIDOCAINE HCL (CARDIAC) PF 100 MG/5ML IV SOSY
PREFILLED_SYRINGE | INTRAVENOUS | Status: DC | PRN
  Administered 2023-07-18: 100 mg via INTRAVENOUS

## 2023-07-18 MED ORDER — SODIUM CHLORIDE 0.9 % IV SOLN
2.0000 g | Freq: Once | INTRAVENOUS | Status: AC
  Administered 2023-07-18: 2 g via INTRAVENOUS
  Filled 2023-07-18: qty 20

## 2023-07-18 MED ORDER — ONDANSETRON HCL 4 MG/2ML IJ SOLN
INTRAMUSCULAR | Status: DC | PRN
  Administered 2023-07-18: 4 mg via INTRAVENOUS

## 2023-07-18 MED ORDER — HYDROMORPHONE HCL 1 MG/ML IJ SOLN: 0.2500 mg | INTRAMUSCULAR | Status: DC | PRN

## 2023-07-18 MED ORDER — ONDANSETRON HCL 4 MG/2ML IJ SOLN
INTRAMUSCULAR | Status: AC
Start: 2023-07-18 — End: ?
  Filled 2023-07-18: qty 6

## 2023-07-18 MED ORDER — FENTANYL CITRATE (PF) 100 MCG/2ML IJ SOLN
INTRAMUSCULAR | Status: AC
  Filled 2023-07-18: qty 2

## 2023-07-18 MED ORDER — LIDOCAINE HCL (PF) 2 % IJ SOLN
INTRAMUSCULAR | Status: AC
  Filled 2023-07-18: qty 5

## 2023-07-18 MED ORDER — SERTRALINE HCL 50 MG PO TABS
50.0000 mg | ORAL_TABLET | Freq: Every day | ORAL | Status: DC
  Administered 2023-07-19: 50 mg via ORAL
  Filled 2023-07-18: qty 1

## 2023-07-18 MED ORDER — METOPROLOL TARTRATE 5 MG/5ML IV SOLN: 5.0000 mg | Freq: Four times a day (QID) | INTRAVENOUS | Status: DC | PRN

## 2023-07-18 MED ORDER — MIDAZOLAM HCL 5 MG/5ML IJ SOLN
INTRAMUSCULAR | Status: DC | PRN
  Administered 2023-07-18: 2 mg via INTRAVENOUS

## 2023-07-18 MED ORDER — DOCUSATE SODIUM 100 MG PO CAPS
100.0000 mg | ORAL_CAPSULE | Freq: Two times a day (BID) | ORAL | Status: DC
  Administered 2023-07-18 – 2023-07-19 (×2): 100 mg via ORAL
  Filled 2023-07-18 (×2): qty 1

## 2023-07-18 MED ORDER — ONDANSETRON 4 MG PO TBDP: 4.0000 mg | ORAL_TABLET | Freq: Four times a day (QID) | ORAL | Status: DC | PRN

## 2023-07-18 MED ORDER — HYDROCHLOROTHIAZIDE 25 MG PO TABS
25.0000 mg | ORAL_TABLET | Freq: Every day | ORAL | Status: DC
  Administered 2023-07-19: 25 mg via ORAL
  Filled 2023-07-18: qty 1

## 2023-07-18 MED ORDER — DEXAMETHASONE SODIUM PHOSPHATE 10 MG/ML IJ SOLN
INTRAMUSCULAR | Status: DC | PRN
  Administered 2023-07-18: 10 mg via INTRAVENOUS

## 2023-07-18 MED ORDER — HYDROMORPHONE HCL 1 MG/ML IJ SOLN: 1.0000 mg | Freq: Once | INTRAMUSCULAR | Status: DC

## 2023-07-18 MED ORDER — SODIUM CHLORIDE 0.9 % IV SOLN
12.5000 mg | INTRAVENOUS | Status: DC | PRN
  Filled 2023-07-18: qty 0.5

## 2023-07-18 MED ORDER — ONDANSETRON HCL 4 MG/2ML IJ SOLN
4.0000 mg | Freq: Once | INTRAMUSCULAR | Status: AC
  Administered 2023-07-18: 4 mg via INTRAVENOUS
  Filled 2023-07-18: qty 2

## 2023-07-18 MED ORDER — METRONIDAZOLE 500 MG/100ML IV SOLN
500.0000 mg | Freq: Once | INTRAVENOUS | Status: AC
  Administered 2023-07-18: 500 mg via INTRAVENOUS
  Filled 2023-07-18: qty 100

## 2023-07-18 MED ORDER — SODIUM CHLORIDE 0.9 % IV SOLN
INTRAVENOUS | Status: AC
  Filled 2023-07-18: qty 2

## 2023-07-18 MED ORDER — MIDAZOLAM HCL 2 MG/2ML IJ SOLN
INTRAMUSCULAR | Status: AC
  Filled 2023-07-18: qty 2

## 2023-07-18 MED ORDER — PANTOPRAZOLE SODIUM 40 MG IV SOLR
40.0000 mg | Freq: Every day | INTRAVENOUS | Status: DC
  Administered 2023-07-18: 40 mg via INTRAVENOUS
  Filled 2023-07-18: qty 10

## 2023-07-18 MED ORDER — STERILE WATER FOR IRRIGATION IR SOLN
Status: DC | PRN
  Administered 2023-07-18: 500 mL

## 2023-07-18 MED ORDER — VALSARTAN-HYDROCHLOROTHIAZIDE 320-25 MG PO TABS
1.0000 | ORAL_TABLET | Freq: Every day | ORAL | Status: DC
Start: 2023-07-18 — End: 2023-07-18

## 2023-07-18 MED ORDER — OXYCODONE HCL 5 MG PO TABS: 5.0000 mg | ORAL_TABLET | ORAL | Status: DC | PRN

## 2023-07-18 MED ORDER — CHLORHEXIDINE GLUCONATE CLOTH 2 % EX PADS: 6.0000 | MEDICATED_PAD | Freq: Once | CUTANEOUS | Status: DC

## 2023-07-18 MED ORDER — LACTATED RINGERS IV SOLN: INTRAVENOUS | Status: DC | PRN

## 2023-07-18 SURGICAL SUPPLY — 45 items
BLADE SURG 15 STRL LF DISP TIS (BLADE) ×2 IMPLANT
CHLORAPREP W/TINT 26 (MISCELLANEOUS) ×2 IMPLANT
COVER TIP SHEARS 8 DVNC (MISCELLANEOUS) ×2 IMPLANT
DRAPE ARM DVNC X/XI (DISPOSABLE) ×6 IMPLANT
DRAPE COLUMN DVNC XI (DISPOSABLE) ×2 IMPLANT
DRSG TEGADERM 4X4.75 (GAUZE/BANDAGES/DRESSINGS) IMPLANT
ELECT REM PT RETURN 9FT ADLT (ELECTROSURGICAL) ×1 IMPLANT
ELECTRODE REM PT RTRN 9FT ADLT (ELECTROSURGICAL) ×2 IMPLANT
GAUZE SPONGE 2X2 STRL 8-PLY (GAUZE/BANDAGES/DRESSINGS) IMPLANT
GLOVE BIO SURGEON STRL SZ 6.5 (GLOVE) ×4 IMPLANT
GLOVE BIOGEL PI IND STRL 6.5 (GLOVE) ×4 IMPLANT
GLOVE BIOGEL PI IND STRL 7.0 (GLOVE) ×4 IMPLANT
GOWN STRL REUS W/TWL LRG LVL3 (GOWN DISPOSABLE) ×6 IMPLANT
GRASPER SUT TROCAR 14GX15 (MISCELLANEOUS) IMPLANT
IRRIGATOR SUCT 8 DISP DVNC XI (IRRIGATION / IRRIGATOR) IMPLANT
KIT PINK PAD W/HEAD ARE REST (MISCELLANEOUS) ×1 IMPLANT
KIT PINK PAD W/HEAD ARM REST (MISCELLANEOUS) ×2 IMPLANT
KIT TURNOVER KIT A (KITS) ×2 IMPLANT
MANIFOLD NEPTUNE II (INSTRUMENTS) ×2 IMPLANT
NDL HYPO 21X1.5 SAFETY (NEEDLE) ×2 IMPLANT
NDL INSUFFLATION 14GA 120MM (NEEDLE) ×2 IMPLANT
NEEDLE HYPO 21X1.5 SAFETY (NEEDLE) ×1 IMPLANT
NEEDLE INSUFFLATION 14GA 120MM (NEEDLE) ×1 IMPLANT
OBTURATOR OPTICAL STND 8 DVNC (TROCAR) ×1 IMPLANT
OBTURATOR OPTICALSTD 8 DVNC (TROCAR) ×2 IMPLANT
PACK LAP CHOLE LZT030E (CUSTOM PROCEDURE TRAY) ×2 IMPLANT
PAD ARMBOARD POSITIONER FOAM (MISCELLANEOUS) ×2 IMPLANT
PENCIL ELECTRO HAND CTR (MISCELLANEOUS) ×2 IMPLANT
RELOAD STAPLE 45 2.5 WHT DVNC (STAPLE) ×2 IMPLANT
RELOAD STAPLER 2.5X45 WHT DVNC (STAPLE) ×1 IMPLANT
SEAL UNIV 5-12 XI (MISCELLANEOUS) ×6 IMPLANT
SEALER VESSEL EXT DVNC XI (MISCELLANEOUS) IMPLANT
SET BASIN LINEN APH (SET/KITS/TRAYS/PACK) ×2 IMPLANT
SET TUBE SMOKE EVAC HIGH FLOW (TUBING) ×2 IMPLANT
STAPLER 45 SUREFORM DVNC (STAPLE) ×2 IMPLANT
STAPLER RELOAD 2.5X45 WHT DVNC (STAPLE) ×1 IMPLANT
STAPLER VISISTAT (STAPLE) IMPLANT
SUT MNCRL AB 4-0 PS2 18 (SUTURE) ×4 IMPLANT
SUT VICRYL 0 UR6 27IN ABS (SUTURE) ×2 IMPLANT
SYR 30ML LL (SYRINGE) ×2 IMPLANT
SYS BAG RETRIEVAL 10MM (BASKET) ×1 IMPLANT
SYSTEM BAG RETRIEVAL 10MM (BASKET) IMPLANT
TAPE TRANSPORE STRL 2 31045 (GAUZE/BANDAGES/DRESSINGS) ×2 IMPLANT
TRAY FOLEY MTR SLVR 16FR STAT (SET/KITS/TRAYS/PACK) ×2 IMPLANT
WATER STERILE IRR 500ML POUR (IV SOLUTION) ×2 IMPLANT

## 2023-07-18 NOTE — Progress Notes (Signed)
 [facility]  Updated his wife. Perforated appendicitis, will stay overnight to make sure gets some more IV antibiotics and also to make sure he tolerate a diet given risk of ileus.  Algis Greenhouse, MD [facility] [address] [place] [place], [place] [address] [phone] (office)

## 2023-07-18 NOTE — Transfer of Care (Signed)
 Immediate Anesthesia Transfer of Care Note  Patient: Calvin Simpson  Procedure(s) Performed: APPENDECTOMY, ROBOT-ASSISTED, LAPAROSCOPIC (Abdomen)  Patient Location: PACU  Anesthesia Type:General  Level of Consciousness: awake  Airway & Oxygen Therapy: Patient connected to face mask oxygen  Post-op Assessment: Post -op Vital signs reviewed and stable  Post vital signs: stable  Last Vitals:  Vitals Value Taken Time  BP 134/87 07/18/23 1137  Temp    Pulse 98 07/18/23 1140  Resp    SpO2 100 % 07/18/23 1140  Vitals shown include unfiled device data.  Last Pain:  Vitals:   07/18/23 0807  TempSrc: Oral  PainSc: 7          Complications: There were no known notable events for this encounter.

## 2023-07-18 NOTE — Anesthesia Preprocedure Evaluation (Signed)
 Anesthesia Evaluation  Patient identified by MRN, date of birth, ID band Patient awake    Reviewed: Allergy & Precautions, H&P , NPO status , Patient's Chart, lab work & pertinent test results, reviewed documented beta blocker date and time   Airway Mallampati: II  TM Distance: >3 FB Neck ROM: full    Dental no notable dental hx. (+) Dental Advisory Given, Teeth Intact   Pulmonary neg pulmonary ROS, former smoker   Pulmonary exam normal breath sounds clear to auscultation       Cardiovascular Exercise Tolerance: Good hypertension, Normal cardiovascular exam Rhythm:regular Rate:Normal     Neuro/Psych negative neurological ROS  negative psych ROS   GI/Hepatic negative GI ROS, Neg liver ROS,GERD  ,,  Endo/Other  negative endocrine ROS    Renal/GU negative Renal ROS  negative genitourinary   Musculoskeletal   Abdominal   Peds  Hematology negative hematology ROS (+)   Anesthesia Other Findings   Reproductive/Obstetrics negative OB ROS                             Anesthesia Physical Anesthesia Plan  ASA: 1 and emergent  Anesthesia Plan: General   Post-op Pain Management: Dilaudid IV   Induction: Intravenous  PONV Risk Score and Plan: Ondansetron, Dexamethasone and Midazolam  Airway Management Planned: Oral ETT  Additional Equipment: None  Intra-op Plan:   Post-operative Plan: Extubation in OR  Informed Consent: I have reviewed the patients History and Physical, chart, labs and discussed the procedure including the risks, benefits and alternatives for the proposed anesthesia with the patient or authorized representative who has indicated his/her understanding and acceptance.     Dental Advisory Given  Plan Discussed with: CRNA  Anesthesia Plan Comments:        Anesthesia Quick Evaluation

## 2023-07-18 NOTE — H&P (Signed)
 [facility] History and Physical  Reason for Referral: Acute appendicitis  Referring Physician: Dr. Wilkie Aye   Chief Complaint   Abdominal Pain     Calvin Simpson is a 38 y.o. male.  HPI:   Discussed the use of AI scribe software for clinical note transcription with the patient, who gave verbal consent to proceed.  The patient presents with appendicitis.  He has been experiencing abdominal pain that became significantly worse on Thursday, describing it as 'absolutely critical.' The pain had been tender for a couple of weeks prior to this escalation. No fever, nausea, or other vomiting episodes aside from one incident.  Initially, he thought the pain might be due to constipation and attempted to alleviate it with Magnesium citrate which resulted in vomiting. He recently consumed a large amount of pizza with extra cheese, which he initially thought might have contributed to his symptoms.  He typically has bowel movements at least once a day, sometimes twice.  He has been recently started on blood pressure medications, specifically amlodipine and valsartan. He is allergic to amoxicillin, which he is not 100% sure of the reaction as a child. His past medical history includes hypertension, fractures, acid reflux, tibia surgery, tonsillectomy, and ear tubes as a child.  No fever, nausea, or vomiting aside from one episode with the magnesium citrate.  Reports tenderness in the abdomen, particularly when moving the leg up and down.  Past Medical History:  Diagnosis Date   Allergy 1994   Amoxicillin   Closed right fibular fracture 09/05/2021   Fx clavicle    Fx wrist    right    GERD (gastroesophageal reflux disease)     Past Surgical History:  Procedure Laterality Date   FRACTURE SURGERY  May 2023   Right tibia and fibula   TIBIA IM NAIL INSERTION Right 09/04/2021   Procedure: INTRAMEDULLARY (IM) NAIL TIBIAL;  Surgeon: Myrene Galas, MD;  Location: [facility] OR;   Service: Orthopedics;  Laterality: Right;   TONSILLECTOMY      Family History  Problem Relation Age of Onset   COPD Mother    Diabetes Father    Hypertension Father    Diabetes Paternal Grandmother    Hypertension Paternal Grandfather    Glaucoma Paternal Grandfather     Social History   Tobacco Use   Smoking status: Former   Smokeless tobacco: Never   Tobacco comments:    patient states he vapes  Vaping Use   Vaping status: Never Used  Substance Use Topics   Alcohol use: No   Drug use: No   3 Medications: I have reviewed the patient's current medications. Current Facility-Administered Medications  Medication Dose Route Frequency Provider Last Rate Last Admin   HYDROmorphone (DILAUDID) injection 1 mg  1 mg Intravenous Q4H PRN Horton, Mayer Masker, MD   1 mg at 07/18/23 1610   Current Outpatient Medications  Medication Sig Dispense Refill Last Dose/Taking   amLODipine (NORVASC) 5 MG tablet Take 1 tablet (5 mg total) by mouth daily. 90 tablet 0    ibuprofen (ADVIL) 600 MG tablet Take 1 tablet (600 mg total) by mouth every 6 (six) hours as needed. 60 tablet 1    Omeprazole Magnesium (PRILOSEC OTC PO) Take 20 mg by mouth daily.      sertraline (ZOLOFT) 50 MG tablet Take 1 tablet (50 mg total) by mouth daily. 30 tablet 1    tadalafil (CIALIS) 20 MG tablet Take 0.5-1 tablets (10-20 mg total) by mouth daily as  needed for erectile dysfunction. 10 tablet 3    valsartan-hydrochlorothiazide (DIOVAN-HCT) 320-25 MG tablet Take 1 tablet by mouth daily. 90 tablet 0      ROS:  A comprehensive review of systems was negative except for: Gastrointestinal: positive for abdominal pain  Blood pressure 120/74, pulse (!) 101, temperature 98.6 F (37 C), temperature source Oral, resp. rate 16, height 6' (1.829 m), weight 93 kg, SpO2 97%. Physical Exam Vitals reviewed.  HENT:     Head: Normocephalic.  Cardiovascular:     Rate and Rhythm: Normal rate.  Neurological:     Mental Status: He is  alert.   GENERAL: Alert, cooperative, well developed, no acute distress. HEENT: Normocephalic, normal oropharynx, moist mucous membranes. CHEST: Clear to auscultation bilaterally, no wheezes, rhonchi, or crackles. CARDIOVASCULAR: Normal heart rate and rhythm, S1 and S2 normal without murmurs. ABDOMEN: Abdomen bloated and distended, RLQ tender, normal bowel sounds. Psoas sign  EXTREMITIES: No cyanosis, edema, or leg swelling. NEUROLOGICAL: Cranial nerves grossly intact, moves all extremities without gross motor or sensory deficit.  Results: Results for orders placed or performed during the hospital encounter of 07/18/23 (from the past 48 hours)  Lipase, blood     Status: None   Collection Time: 07/18/23  3:42 AM  Result Value Ref Range   Lipase 27 11 - 51 U/L    Comment: Performed at [facility], [address], [place], [place] [address]  Comprehensive metabolic panel     Status: Abnormal   Collection Time: 07/18/23  3:42 AM  Result Value Ref Range   Sodium 130 (L) 135 - 145 mmol/L   Potassium 4.0 3.5 - 5.1 mmol/L   Chloride 96 (L) 98 - 111 mmol/L   CO2 24 22 - 32 mmol/L   Glucose, Bld 118 (H) 70 - 99 mg/dL    Comment: Glucose reference range applies only to samples taken after fasting for at least 8 hours.   BUN 13 6 - 20 mg/dL   Creatinine, Ser 6.04 (H) 0.61 - 1.24 mg/dL   Calcium 9.6 8.9 - 54.0 mg/dL   Total Protein 8.2 (H) 6.5 - 8.1 g/dL   Albumin 4.1 3.5 - 5.0 g/dL   AST 26 15 - 41 U/L   ALT 55 (H) 0 - 44 U/L   Alkaline Phosphatase 60 38 - 126 U/L   Total Bilirubin 1.7 (H) 0.0 - 1.2 mg/dL   GFR, Estimated >98 >11 mL/min    Comment: (NOTE) Calculated using the CKD-EPI Creatinine Equation (2021)    Anion gap 10 5 - 15    Comment: Performed at [facility], [address], [place], [place] [address]  CBC     Status: Abnormal   Collection Time: 07/18/23  3:42 AM  Result Value Ref Range   WBC 14.2 (H) 4.0 - 10.5 K/uL   RBC 5.07 4.22 - 5.81 MIL/uL   Hemoglobin 16.1 13.0 -  17.0 g/dL   HCT 29.5 62.1 - 30.8 %   MCV 91.9 80.0 - 100.0 fL   MCH 31.8 26.0 - 34.0 pg   MCHC 34.5 30.0 - 36.0 g/dL   RDW 65.7 84.6 - 96.2 %   Platelets 293 150 - 400 K/uL   nRBC 0.0 0.0 - 0.2 %    Comment: Performed at [facility], [address] [place], [place] [address]  Urinalysis, Routine w reflex microscopic -Urine, Clean Catch     Status: Abnormal   Collection Time: 07/18/23  3:42 AM  Result Value Ref Range  Color, Urine YELLOW YELLOW   APPearance CLEAR CLEAR   Specific Gravity, Urine 1.009 1.005 - 1.030   pH 7.0 5.0 - 8.0   Glucose, UA NEGATIVE NEGATIVE mg/dL   Hgb urine dipstick MODERATE (A) NEGATIVE   Bilirubin Urine NEGATIVE NEGATIVE   Ketones, ur NEGATIVE NEGATIVE mg/dL   Protein, ur NEGATIVE NEGATIVE mg/dL   Nitrite NEGATIVE NEGATIVE   Leukocytes,Ua NEGATIVE NEGATIVE   RBC / HPF 0-5 0 - 5 RBC/hpf   WBC, UA 0-5 0 - 5 WBC/hpf   Bacteria, UA NONE SEEN NONE SEEN   Squamous Epithelial / HPF 0-5 0 - 5 /HPF    Comment: Performed at [facility], [address], [place], [place] [address]   Personally reviewed and showed patient and family- dilated appendix with appendicolith X 2 CT ABDOMEN PELVIS W CONTRAST Result Date: 07/18/2023 CLINICAL DATA:  Right lower quadrant abdominal pain. EXAM: CT ABDOMEN AND PELVIS WITH CONTRAST TECHNIQUE: Multidetector CT imaging of the abdomen and pelvis was performed using the standard protocol following bolus administration of intravenous contrast. RADIATION DOSE REDUCTION: This exam was performed according to the departmental dose-optimization program which includes automated exposure control, adjustment of the mA and/or kV according to patient size and/or use of iterative reconstruction technique. CONTRAST:  OMNIPAQUE IOHEXOL 300 MG/ML  SOLN COMPARISON:  None Available. FINDINGS: Lower chest: Dependent atelectasis in the lung bases. Hepatobiliary: No suspicious focal abnormality within the liver parenchyma. There is no evidence for  gallstones, gallbladder wall thickening, or pericholecystic fluid. No intrahepatic or extrahepatic biliary dilation. Pancreas: No focal mass lesion. No dilatation of the main duct. No intraparenchymal cyst. No peripancreatic edema. Spleen: No splenomegaly. No suspicious focal mass lesion. Adrenals/Urinary Tract: No adrenal nodule or mass. Numerous tiny well-defined homogeneous low-density lesions in both kidneys are too small to characterize but are statistically most likely benign and probably cysts. No followup imaging is recommended. 6 mm nonobstructing stone identified upper pole left kidney No evidence for hydroureter. The urinary bladder appears normal for the degree of distention. Stomach/Bowel: Stomach is unremarkable. No gastric wall thickening. No evidence of outlet obstruction. Duodenum is normally positioned as is the ligament of Treitz. Duodenal diverticulum noted. No small bowel wall thickening. No small bowel dilatation. The terminal ileum is normal. Appendicoliths is identified at the base of the appendix, measuring on the order of 16 x 8 x 8 mm. The mid appendix and tip of appendix are markedly dilated up to 16 mm diameter. Appendiceal lumen is fluid-filled with several additional tiny stones seen in the tip of the appendix. Substantial periappendiceal edema/inflammation evident. No extraluminal gas in the right lower quadrant. No rim enhancing fluid collection to suggest abscess. No gross colonic mass. No colonic wall thickening. Vascular/Lymphatic: There is mild atherosclerotic calcification of the abdominal aorta without aneurysm. There is no gastrohepatic or hepatoduodenal ligament lymphadenopathy. No retroperitoneal or mesenteric lymphadenopathy. No pelvic sidewall lymphadenopathy. Reproductive: The prostate gland and seminal vesicles are unremarkable. Other: No intraperitoneal free fluid. Musculoskeletal: No worrisome lytic or sclerotic osseous abnormality. IMPRESSION: 1. Acute appendicitis  with appendicoliths at the base of the appendix and in the tip of the appendix. Substantial periappendiceal edema/inflammation evident. No extraluminal gas in the right lower quadrant. No rim enhancing fluid collection to suggest abscess. 2. 6 mm nonobstructing stone upper pole left kidney. 3. Numerous tiny well-defined homogeneous low-density lesions in both kidneys are too small to characterize but are statistically most likely benign and probably cysts. No specific imaging follow-up warranted. Electronically Signed  By: Kennith Center M.D.   On: 07/18/2023 05:53     Assessment and Plan:    GLORIA LAMBERTSON is a 38 y.o. male with Acute Appendicitis with Appendicoliths.  Discussed the risk of laparoscopic appendectomy and the option of antibiotics alone. Discussed that in [place] and some trials in the [place], antibiotics are used for simple appendicitis. Discussed that research shows a 40% failure rate for antibiotics alone.  Discussed risk of surgery including but not limited to bleeding, infection, injury to other organs, normal appendix, and after this discussion the patient has decided to proceed with surgery.  CT scan confirmed enlarged appendix with two appendicoliths, indicating inflammation. High failure rate of nonoperative management due to appendicoliths. Surgical intervention preferred. - Perform laparoscopic appendectomy using robotic assistance. - Administer preoperative antibiotics. - Monitor for perforation during surgery; consider postoperative antibiotics and observation if present. - Discuss surgical procedure, risks, and postoperative care. - Send postoperative pain management prescription to [facility] on [address]. - Advise on two-week work leave, adjust based on recovery. - Schedule follow-up phone call in two weeks.   All questions were answered to the satisfaction of the patient and family.   Lucretia Roers 07/18/2023, 9:33 AM

## 2023-07-18 NOTE — Anesthesia Postprocedure Evaluation (Signed)
 Anesthesia Post Note  Patient: Calvin Simpson  Procedure(s) Performed: APPENDECTOMY, ROBOT-ASSISTED, LAPAROSCOPIC (Abdomen)  Patient location during evaluation: PACU Anesthesia Type: General Level of consciousness: awake and alert Pain management: pain level controlled Vital Signs Assessment: post-procedure vital signs reviewed and stable Respiratory status: spontaneous breathing, nonlabored ventilation, respiratory function stable and patient connected to nasal cannula oxygen Cardiovascular status: blood pressure returned to baseline and stable Postop Assessment: no apparent nausea or vomiting Anesthetic complications: no   There were no known notable events for this encounter.   Last Vitals:  Vitals:   07/18/23 1137 07/18/23 1145  BP: 134/87 134/85  Pulse: (!) 102 96  Resp:  19  Temp: 37.7 C   SpO2: 90% 100%    Last Pain:  Vitals:   07/18/23 1137  TempSrc:   PainSc: Asleep                 Edla Para L Tameika Heckmann

## 2023-07-18 NOTE — ED Notes (Signed)
 Patient transported to PACU by OR staff.

## 2023-07-18 NOTE — ED Provider Notes (Signed)
 [place] EMERGENCY DEPARTMENT AT [facility] Provider Note   CSN: [id number] Arrival date & time: 07/18/23  5784     History  Chief Complaint  Patient presents with   Abdominal Pain    Calvin Simpson is a 38 y.o. male.  HPI    This is a 38 year old male who presents with abdominal pain.  Patient reports he first noted symptoms on Thursday.  It was mostly mid and epigastric and dull in nature.  He states that it is progressively gotten worse.  He thought he may have been constipated and took magnesium citrate but vomited up immediately.  Has had bowel movements.  Reports minimal p.o. intake.  Denies fevers.  States that the pain is worse and more in his right side now.  It is worse with movement.  No history of abdominal surgeries. Home Medications Prior to Admission medications   Medication Sig Start Date End Date Taking? Authorizing Provider  amLODipine (NORVASC) 5 MG tablet Take 1 tablet (5 mg total) by mouth daily. 07/14/23   Jeani Sow, MD  ibuprofen (ADVIL) 600 MG tablet Take 1 tablet (600 mg total) by mouth every 6 (six) hours as needed. 07/07/23   Jeani Sow, MD  Omeprazole Magnesium (PRILOSEC OTC PO) Take 20 mg by mouth daily.    [provider]  sertraline (ZOLOFT) 50 MG tablet Take 1 tablet (50 mg total) by mouth daily. 07/14/23   Jeani Sow, MD  tadalafil (CIALIS) 20 MG tablet Take 0.5-1 tablets (10-20 mg total) by mouth daily as needed for erectile dysfunction. 07/14/23   Jeani Sow, MD  valsartan-hydrochlorothiazide (DIOVAN-HCT) 320-25 MG tablet Take 1 tablet by mouth daily. 07/14/23   Jeani Sow, MD      Allergies    Amoxicillin    Review of Systems   Review of Systems  Constitutional:  Negative for fever.  Respiratory:  Negative for shortness of breath.   Cardiovascular:  Negative for chest pain.  Gastrointestinal:  Positive for abdominal pain, nausea and vomiting. Negative for diarrhea.  All other systems  reviewed and are negative.   Physical Exam Updated Vital Signs BP (!) 133/91 (BP Location: Right Arm)   Pulse (!) 124   Temp 98.6 F (37 C) (Oral)   Resp 18   Ht 1.829 m (6')   Wt 93 kg   SpO2 94%   BMI 27.81 kg/m  Physical Exam Vitals and nursing note reviewed.  Constitutional:      Appearance: He is well-developed.  HENT:     Head: Normocephalic and atraumatic.  Eyes:     Pupils: Pupils are equal, round, and reactive to light.  Cardiovascular:     Rate and Rhythm: Regular rhythm. Tachycardia present.     Heart sounds: Normal heart sounds. No murmur heard. Pulmonary:     Effort: Pulmonary effort is normal. No respiratory distress.     Breath sounds: Normal breath sounds. No wheezing.  Abdominal:     General: Bowel sounds are normal.     Palpations: Abdomen is soft.     Tenderness: There is abdominal tenderness in the right lower quadrant. There is guarding. There is no rebound. Positive signs include Rovsing's sign.  Musculoskeletal:     Cervical back: Neck supple.  Lymphadenopathy:     Cervical: No cervical adenopathy.  Skin:    General: Skin is warm and dry.  Neurological:     Mental Status: He is alert and oriented to person, place,  and time.  Psychiatric:        Mood and Affect: Mood normal.     ED Results / Procedures / Treatments   Labs (all labs ordered are listed, but only abnormal results are displayed) Labs Reviewed  COMPREHENSIVE METABOLIC PANEL - Abnormal; Notable for the following components:      Result Value   Sodium 130 (*)    Chloride 96 (*)    Glucose, Bld 118 (*)    Creatinine, Ser 1.25 (*)    Total Protein 8.2 (*)    ALT 55 (*)    Total Bilirubin 1.7 (*)    All other components within normal limits  CBC - Abnormal; Notable for the following components:   WBC 14.2 (*)    All other components within normal limits  URINALYSIS, ROUTINE W REFLEX MICROSCOPIC - Abnormal; Notable for the following components:   Hgb urine dipstick MODERATE  (*)    All other components within normal limits  LIPASE, BLOOD    EKG None  Radiology CT ABDOMEN PELVIS W CONTRAST Result Date: 07/18/2023 CLINICAL DATA:  Right lower quadrant abdominal pain. EXAM: CT ABDOMEN AND PELVIS WITH CONTRAST TECHNIQUE: Multidetector CT imaging of the abdomen and pelvis was performed using the standard protocol following bolus administration of intravenous contrast. RADIATION DOSE REDUCTION: This exam was performed according to the departmental dose-optimization program which includes automated exposure control, adjustment of the mA and/or kV according to patient size and/or use of iterative reconstruction technique. CONTRAST:  OMNIPAQUE IOHEXOL 300 MG/ML  SOLN COMPARISON:  None Available. FINDINGS: Lower chest: Dependent atelectasis in the lung bases. Hepatobiliary: No suspicious focal abnormality within the liver parenchyma. There is no evidence for gallstones, gallbladder wall thickening, or pericholecystic fluid. No intrahepatic or extrahepatic biliary dilation. Pancreas: No focal mass lesion. No dilatation of the main duct. No intraparenchymal cyst. No peripancreatic edema. Spleen: No splenomegaly. No suspicious focal mass lesion. Adrenals/Urinary Tract: No adrenal nodule or mass. Numerous tiny well-defined homogeneous low-density lesions in both kidneys are too small to characterize but are statistically most likely benign and probably cysts. No followup imaging is recommended. 6 mm nonobstructing stone identified upper pole left kidney No evidence for hydroureter. The urinary bladder appears normal for the degree of distention. Stomach/Bowel: Stomach is unremarkable. No gastric wall thickening. No evidence of outlet obstruction. Duodenum is normally positioned as is the ligament of Treitz. Duodenal diverticulum noted. No small bowel wall thickening. No small bowel dilatation. The terminal ileum is normal. Appendicoliths is identified at the base of the appendix,  measuring on the order of 16 x 8 x 8 mm. The mid appendix and tip of appendix are markedly dilated up to 16 mm diameter. Appendiceal lumen is fluid-filled with several additional tiny stones seen in the tip of the appendix. Substantial periappendiceal edema/inflammation evident. No extraluminal gas in the right lower quadrant. No rim enhancing fluid collection to suggest abscess. No gross colonic mass. No colonic wall thickening. Vascular/Lymphatic: There is mild atherosclerotic calcification of the abdominal aorta without aneurysm. There is no gastrohepatic or hepatoduodenal ligament lymphadenopathy. No retroperitoneal or mesenteric lymphadenopathy. No pelvic sidewall lymphadenopathy. Reproductive: The prostate gland and seminal vesicles are unremarkable. Other: No intraperitoneal free fluid. Musculoskeletal: No worrisome lytic or sclerotic osseous abnormality. IMPRESSION: 1. Acute appendicitis with appendicoliths at the base of the appendix and in the tip of the appendix. Substantial periappendiceal edema/inflammation evident. No extraluminal gas in the right lower quadrant. No rim enhancing fluid collection to suggest abscess. 2. 6 mm  nonobstructing stone upper pole left kidney. 3. Numerous tiny well-defined homogeneous low-density lesions in both kidneys are too small to characterize but are statistically most likely benign and probably cysts. No specific imaging follow-up warranted. Electronically Signed   By: Kennith Center M.D.   On: 07/18/2023 05:53    Procedures Procedures    Medications Ordered in ED Medications  cefTRIAXone (ROCEPHIN) 2 g in sodium chloride 0.9 % 100 mL IVPB (2 g Intravenous New Bag/Given 07/18/23 0608)    And  metroNIDAZOLE (FLAGYL) IVPB 500 mg (has no administration in time range)  sodium chloride 0.9 % bolus 1,000 mL (0 mLs Intravenous Stopped 07/18/23 0558)  morphine (PF) 4 MG/ML injection 4 mg (4 mg Intravenous Given 07/18/23 0414)  ondansetron (ZOFRAN) injection 4 mg (4 mg  Intravenous Given 07/18/23 0414)  iohexol (OMNIPAQUE) 300 MG/ML solution 100 mL (100 mLs Intravenous Contrast Given 07/18/23 0444)  0.9 %  sodium chloride infusion ( Intravenous New Bag/Given 07/18/23 0608)    ED Course/ Medical Decision Making/ A&P                                 Medical Decision Making Amount and/or Complexity of Data Reviewed Radiology: ordered.  Risk Prescription drug management. Decision regarding hospitalization.   This patient presents to the ED for concern of abdominal pain, this involves an extensive number of treatment options, and is a complaint that carries with it a high risk of complications and morbidity.  I considered the following differential and admission for this acute, potentially life threatening condition.  The differential diagnosis includes appendicitis, cholecystitis, gastritis, colitis, kidney stone  MDM:    This is a 38 year old male who presents with abdominal pain.  He is nontoxic-appearing.  Vital signs notable for tachycardia.  He is afebrile.  He has signs of mild peritonitis on exam with positive Rovsing's.  History and physical exam most suggestive of likely appendicitis.  Patient was given pain and nausea medication.  Labs reveal leukocytosis.  He also has a mild hyponatremia and mild AKI which may be related to dehydration and decreased p.o. intake.  He was given fluids.  CT scan was obtained.  Does show signs of appendicitis.  No evidence of perforation or abscess.  He was given Rocephin and Flagyl.  Discussed with Dr. Henreitta Leber, general surgery.  She will evaluate later this morning for appendectomy.  Patient was redosed pain medication.  He and his wife were updated at the bedside.  (Labs, imaging, consults)  Labs: I Ordered, and personally interpreted labs.  The pertinent results include: CBC, CMP, lipase, urinalysis  Imaging Studies ordered: I ordered imaging studies including CT I independently visualized and interpreted  imaging. I agree with the radiologist interpretation  Additional history obtained from wife at bedside.  External records from outside source obtained and reviewed including prior evaluations  Cardiac Monitoring: The patient was maintained on a cardiac monitor.  If on the cardiac monitor, I personally viewed and interpreted the cardiac monitored which showed an underlying rhythm of: Sinus tachycardia  Reevaluation: After the interventions noted above, I reevaluated the patient and found that they have :stayed the same  Social Determinants of Health:  lives independently  Disposition: Admit  Co morbidities that complicate the patient evaluation  Past Medical History:  Diagnosis Date   Allergy 1994   Amoxicillin   Closed right fibular fracture 09/05/2021   Fx clavicle    Fx wrist  right    GERD (gastroesophageal reflux disease)      Medicines Meds ordered this encounter  Medications   sodium chloride 0.9 % bolus 1,000 mL   morphine (PF) 4 MG/ML injection 4 mg   ondansetron (ZOFRAN) injection 4 mg   iohexol (OMNIPAQUE) 300 MG/ML solution 100 mL   AND Linked Order Group    cefTRIAXone (ROCEPHIN) 2 g in sodium chloride 0.9 % 100 mL IVPB     Antibiotic Indication::   Intra-abdominal    metroNIDAZOLE (FLAGYL) IVPB 500 mg     Antibiotic Indication::   Intra-abdominal Infection   0.9 %  sodium chloride infusion    I have reviewed the patients home medicines and have made adjustments as needed  Problem List / ED Course: Problem List Items Addressed This Visit   None Visit Diagnoses       Acute appendicitis with localized peritonitis, without perforation, abscess, or gangrene    -  Primary                   Final Clinical Impression(s) / ED Diagnoses Final diagnoses:  Acute appendicitis with localized peritonitis, without perforation, abscess, or gangrene    Rx / DC Orders ED Discharge Orders     None         Shon Baton, MD 07/18/23  [phone]

## 2023-07-18 NOTE — Discharge Instructions (Signed)
 Discharge Robotic Assisted Laparoscopic Surgery Instructions:  Common Complaints: Right shoulder pain is common after laparoscopic surgery.  This is secondary to the gas used in the surgery being trapped under the diaphragm.  Walk to help your body absorb the gas. This will improve in a few days. Pain at the port sites are common, especially the larger port sites. This will improve with time.  Some nausea is common and poor appetite. The main goal is to stay hydrated the first few days after surgery.   Diet/ Activity: Diet as tolerated. You may not have an appetite, but it is important to stay hydrated.  Drink 64 ounces of water a day. Your appetite will return with time.  Your bandages can be removed 48 hours after your surgery.  Shower per your regular routine daily. Walk everyday for at least 15-20 minutes. Deep cough and move around every 1-2 hours in the first few days after surgery.  Do not lift > 10 lbs, perform excessive bending, pushing, pulling, squatting for 1-2 weeks after surgery.  Do not pick at the staples.  Do not place lotions or balms on your incision unless instructed to specifically by Dr. Henreitta Leber.   Pain Expectations and Narcotics: -After surgery you will have pain associated with your incisions and this is normal. The pain is muscular and nerve pain, and will get better with time. -You are encouraged and expected to take non narcotic medications like tylenol and ibuprofen (when able) to treat pain as multiple modalities can aid with pain treatment. -Narcotics are only used when pain is severe or there is breakthrough pain. -You are not expected to have a pain score of 0 after surgery, as we cannot prevent pain. A pain score of 3-4 that allows you to be functional, move, walk, and tolerate some activity is the goal. The pain will continue to improve over the days after surgery and is dependent on your surgery. -Due to [place] law, we are only able to give a certain amount of  pain medication to treat post operative pain, and we only give additional narcotics on a patient by patient basis.  -For most laparoscopic surgery, studies have shown that the majority of patients only need 10-15 narcotic pills, and for open surgeries most patients only need 15-20.   -Having appropriate expectations of pain and knowledge of pain management with non narcotics is important as we do not want anyone to become addicted to narcotic pain medication.  -Using ice packs in the first 48 hours and heating pads after 48 hours, wearing an abdominal binder (when recommended), and using over the counter medications are all ways to help with pain management.   -Simple acts like meditation and mindfulness practices after surgery can also help with pain control and research has proven the benefit of these practices.  Medication: Take tylenol and ibuprofen as needed for pain control, alternating every 4-6 hours.  Example:  Tylenol 1000mg  @ 6am, 12noon, 6pm, (Do not exceed 4000mg  of tylenol a day). Ibuprofen 800mg  @ 9am, 3pm, 9pm, 3am (Do not exceed 3600mg  of ibuprofen a day).  Take Roxicodone for breakthrough pain every 4 hours.  Take Colace for constipation related to narcotic pain medication. If you do not have a bowel movement in 2 days, take Miralax over the counter.  Drink plenty of water to also prevent constipation.   Contact Information: If you have questions or concerns, please call our office, [phone], Monday- Thursday 8AM-5PM and Friday 8AM-12Noon.  If it is  after hours or on the weekend, please call [facility] Main Number, [phone], [phone], and ask to speak to the surgeon on call for Dr. Henreitta Leber at [facility].

## 2023-07-18 NOTE — Op Note (Signed)
 Preoperative diagnosis: Acute appendicitis  Postoperative diagnosis: Acute perforated appendicitis   Procedure: Robotic assisted laparoscopic appendectomy.  Anesthesia: General   Surgeon: Algis Greenhouse, MD   Wound Classification: Dirty infected   Specimen: Appendix  Complications: None  Estimated Blood Loss: < 10cc   Indications: Patient is a 38 y.o. male  presented with right lower abdominal pain and findings of acute appendicitis on CT scan and two appendicoliths. The risk of surgery were explained to the patient including but not limited to bleeding, infection, finding a rupture, injury to other organs, needing to do an open procedure.   FIndings: Upon entering the abdomen (organ space), I encountered purulence in the right lower quadrant and necrotic wall with perforation of the mid appendix  .    Description of procedure: The patient was placed on the operating table in the supine position, left arm tucked. General anesthesia was induced. A time-out was completed verifying correct patient, procedure, site, positioning, and implant(s) and/or special equipment prior to beginning this procedure. The abdomen was prepped and draped in the usual sterile fashion.   In the Palmer's point an incision was made and Veress needle was inserted.  After confirming intraabdominal location with positive saline drop test and low insufflation pressures, gas insufflation was initiated until the abdominal pressure was measured at 15 mmHg.  Afterwards, the Veress needle was removed and a 8 mm port was placed through a Palmer's point site using Optiview technique. No injuries were noted. Two additional incision was made 8 cm apart down the left abdomen.  An 8 mm port was in the inferior most incision and  initial incision was upsized to a 12mm port, both were placed under direct visualization.  The final 8 mm port was placed between the two. No injuries from trocar placements were noted. The table flexed  and was placed in the Trendelenburg position with the right side elevated. Robotic platform was then brought to the operative field and docked. A vessel sealer was placed through the 12 mm port and a forced bipolar through the lower 8 mm port.   Upon entering the abdomen (organ space), I encountered purulence in the right lower quadrant and necrotic wall with perforation of the mid appendix . An appendix that appeared dilated and had a necrotic mid wall with perforation and purulence was identified and elevated.  Infection was present within the abdominal cavity due to appendicitis. Window created at base of appendix in the mesentery.  A white load linear cutting stapler was placed through the 12 mm port, and then used to divide and staple the base of the appendix. The vessel sealer was used to ligate the mesoappendix. No bleeding from the staple lines noted.  Suction irrigation was used to suction the purulence and then I locally did some minor irrigation to further clean up the RLQ and the pelvic region. The appendix was placed in an endoscopic retrieval bag and removed through the 12 mm port.   There was no appendiceal stump as the staple line was flush with the cecum and mesoappendix staple line examined again and hemostasis noted. No other pathology was identified within pelvis. The 12 mm trocar removed and port site closed with PMI using 0 vicryl under direct vision. Remaining trocars were removed. No bleeding was noted.The abdomen was allowed to collapse. Staples were used to close the skin incisions and dressing were placed.   The patient tolerated the procedure well, awakened from anesthesia and was taken to the postanesthesia care unit  in satisfactory condition.  Sponge count and instrument count correct at the end of the procedure.  Algis Greenhouse, MD [facility] [address] [place], [place] [address] [phone] (office)

## 2023-07-18 NOTE — Plan of Care (Signed)

## 2023-07-18 NOTE — ED Triage Notes (Signed)
 Pt with c/o abdominal pain that started 3 days ago and has progressively gotten worse. Pt states it "started on L side and is now across the entire lower abdomen". Reports movement makes worse. Denies n/v/d.

## 2023-07-19 ENCOUNTER — Encounter: Payer: Self-pay | Admitting: Family Medicine

## 2023-07-19 ENCOUNTER — Encounter (HOSPITAL_COMMUNITY): Payer: Self-pay | Admitting: General Surgery

## 2023-07-19 LAB — CBC
HCT: 41.4 % (ref 39.0–52.0)
Hemoglobin: 13.5 g/dL (ref 13.0–17.0)
MCH: 31.4 pg (ref 26.0–34.0)
MCHC: 32.6 g/dL (ref 30.0–36.0)
MCV: 96.3 fL (ref 80.0–100.0)
Platelets: 226 10*3/uL (ref 150–400)
RBC: 4.3 MIL/uL (ref 4.22–5.81)
RDW: 12.6 % (ref 11.5–15.5)
WBC: 11.4 10*3/uL — ABNORMAL HIGH (ref 4.0–10.5)
nRBC: 0 % (ref 0.0–0.2)

## 2023-07-19 LAB — BASIC METABOLIC PANEL
Anion gap: 11 (ref 5–15)
BUN: 11 mg/dL (ref 6–20)
CO2: 22 mmol/L (ref 22–32)
Calcium: 8.8 mg/dL — ABNORMAL LOW (ref 8.9–10.3)
Chloride: 102 mmol/L (ref 98–111)
Creatinine, Ser: 1.17 mg/dL (ref 0.61–1.24)
GFR, Estimated: >60 mL/min (ref >60)
Glucose, Bld: 115 mg/dL — ABNORMAL HIGH (ref 70–99)
Potassium: 4.2 mmol/L (ref 3.5–5.1)
Sodium: 135 mmol/L (ref 135–145)

## 2023-07-19 MED ORDER — METRONIDAZOLE 500 MG PO TABS
500.0000 mg | ORAL_TABLET | Freq: Three times a day (TID) | ORAL | 0 refills | Status: AC
Start: 2023-07-19 — End: 2023-07-24

## 2023-07-19 MED ORDER — DOCUSATE SODIUM 100 MG PO CAPS: 100.0000 mg | ORAL_CAPSULE | Freq: Two times a day (BID) | ORAL | 0 refills | Status: DC | PRN

## 2023-07-19 MED ORDER — ORAL CARE MOUTH RINSE: 15.0000 mL | OROMUCOSAL | Status: DC | PRN

## 2023-07-19 MED ORDER — ONDANSETRON 4 MG PO TBDP: 4.0000 mg | ORAL_TABLET | Freq: Four times a day (QID) | ORAL | 0 refills | Status: DC | PRN

## 2023-07-19 MED ORDER — OXYCODONE HCL 5 MG PO TABS
5.0000 mg | ORAL_TABLET | ORAL | 0 refills | Status: DC | PRN
Start: 2023-07-19 — End: 2023-07-27

## 2023-07-19 MED ORDER — CIPROFLOXACIN HCL 500 MG PO TABS
500.0000 mg | ORAL_TABLET | Freq: Two times a day (BID) | ORAL | 0 refills | Status: AC
Start: 2023-07-19 — End: 2023-07-24

## 2023-07-19 MED ORDER — CYCLOBENZAPRINE HCL 5 MG PO TABS: 5.0000 mg | ORAL_TABLET | Freq: Three times a day (TID) | ORAL | 0 refills | Status: DC | PRN

## 2023-07-19 NOTE — TOC CM/SW Note (Signed)
 Transition of Care [facility]) - Inpatient Brief Assessment   Patient Details  Name: Calvin Simpson MRN: [id number] Date of Birth: 1985/09/24  Transition of Care [facility]) CM/SW Contact:    Isabella Bowens, LCSWA Phone Number: 07/19/2023, 7:32 AM   Clinical Narrative:  Transition of Care Department [facility]) has reviewed patient and no TOC needs have been identified at this time. We will continue to monitor patient advancement through interdisciplinary progression rounds. If new patient transition needs arise, please place a TOC consult.   Transition of Care Asessment: Insurance and Status: Insurance coverage has been reviewed Patient has primary care physician: Yes Home environment has been reviewed: Single Family Home Prior level of function:: Independent Prior/Current Home Services: No current home services Social Drivers of Health Review: SDOH reviewed interventions complete (Added utility assistance resources to AVS) Readmission risk has been reviewed: Yes Transition of care needs: no transition of care needs at this time

## 2023-07-19 NOTE — Discharge Summary (Signed)
 Physician Discharge Summary  Patient ID: Calvin Simpson MRN: [id number] DOB/AGE: 36-Feb-1987 35 y.o.  Admit date: 07/18/2023 Discharge date: 07/19/2023  Admission Diagnoses: Acute appendicitis   Discharge Diagnoses:  Principal Problem:   Acute perforated appendicitis   Discharged Condition: good  Hospital Course: Mr. Schue is a 38 yo who was diagnosed with acute appendicitis. I took him to surgery and he had perforated appendicitis. He stayed overnight for antibiotics and to make sure he ate. He  was doing well this morning eating and having BM and pain control.   Consults: None  Significant Diagnostic Studies: CT with appendicitis   Treatments: IV hydration; antibiotics, Robotic assisted laparoscopic appendectomy 07/18/23  Discharge Exam: Blood pressure 105/66, pulse 79, temperature 98.1 F (36.7 C), temperature source Oral, resp. rate 18, height 6' (1.829 m), weight 93 kg, SpO2 98%. General appearance: alert and no distress GI: soft, mildly distended, port sites c/d/I with gauze   Disposition: Discharge disposition: 01-Home or Self Care       Discharge Instructions     Call MD for:  difficulty breathing, headache or visual disturbances   Complete by: As directed    Call MD for:  extreme fatigue   Complete by: As directed    Call MD for:  persistant dizziness or light-headedness   Complete by: As directed    Call MD for:  persistant nausea and vomiting   Complete by: As directed    Call MD for:  redness, tenderness, or signs of infection (pain, swelling, redness, odor or green/yellow discharge around incision site)   Complete by: As directed    Call MD for:  severe uncontrolled pain   Complete by: As directed    Call MD for:  temperature >100.4   Complete by: As directed    Increase activity slowly   Complete by: As directed       Allergies as of 07/19/2023       Reactions   Amoxicillin Hives        Medication List     TAKE these medications     acetaminophen 500 MG tablet Commonly known as: TYLENOL Take 1,000 mg by mouth every 6 (six) hours as needed for mild pain (pain score 1-3).   amLODipine 5 MG tablet Commonly known as: NORVASC Take 1 tablet (5 mg total) by mouth daily.   ciprofloxacin 500 MG tablet Commonly known as: Cipro Take 1 tablet (500 mg total) by mouth 2 (two) times daily for 5 days.   cyclobenzaprine 5 MG tablet Commonly known as: FLEXERIL Take 1 tablet (5 mg total) by mouth 3 (three) times daily as needed for muscle spasms.   docusate sodium 100 MG capsule Commonly known as: COLACE Take 1 capsule (100 mg total) by mouth 2 (two) times daily as needed for mild constipation.   GOODYS BODY PAIN PO Take 2-3 Packages by mouth 4 (four) times daily as needed (pain).   ibuprofen 600 MG tablet Commonly known as: ADVIL Take 1 tablet (600 mg total) by mouth every 6 (six) hours as needed. What changed: reasons to take this   metroNIDAZOLE 500 MG tablet Commonly known as: Flagyl Take 1 tablet (500 mg total) by mouth 3 (three) times daily for 5 days.   ondansetron 4 MG disintegrating tablet Commonly known as: ZOFRAN-ODT Take 1 tablet (4 mg total) by mouth every 6 (six) hours as needed for nausea.   oxyCODONE 5 MG immediate release tablet Commonly known as: Oxy IR/ROXICODONE Take 1-2 tablets (5-10 mg  total) by mouth every 4 (four) hours as needed for moderate pain (pain score 4-6).   PRILOSEC OTC PO Take 20 mg by mouth daily.   tadalafil 20 MG tablet Commonly known as: CIALIS Take 0.5-1 tablets (10-20 mg total) by mouth daily as needed for erectile dysfunction.   valsartan-hydrochlorothiazide 320-25 MG tablet Commonly known as: DIOVAN-HCT Take 1 tablet by mouth daily.        Follow-up Information     Lucretia Roers, MD Follow up on 07/29/2023.   Specialty: General Surgery Why: post op check, staple removal Contact information: [address] [place] [facility] [address] [phone]                  Signed: Lucretia Roers 07/19/2023, 1:18 PM

## 2023-07-20 LAB — SURGICAL PATHOLOGY

## 2023-07-27 ENCOUNTER — Ambulatory Visit (INDEPENDENT_AMBULATORY_CARE_PROVIDER_SITE_OTHER): Admitting: General Surgery

## 2023-07-27 ENCOUNTER — Encounter: Payer: Self-pay | Admitting: General Surgery

## 2023-07-27 VITALS — BP 144/92 | HR 63 | Temp 97.6°F | Resp 14 | Ht 72.0 in | Wt 196.0 lb

## 2023-07-27 DIAGNOSIS — K429 Umbilical hernia without obstruction or gangrene: Secondary | ICD-10-CM | POA: Insufficient documentation

## 2023-07-27 DIAGNOSIS — K3532 Acute appendicitis with perforation and localized peritonitis, without abscess: Secondary | ICD-10-CM

## 2023-07-27 NOTE — Progress Notes (Unsigned)
 [facility]  Doing ok. No major issues. Having improved pain and not having headaches like he had prior.  BP (!) 144/92   Pulse 63   Temp 97.6 F (36.4 C) (Oral)   Resp 14   Ht 6' (1.829 m)   Wt 196 lb (88.9 kg)   SpO2 97%   BMI 26.58 kg/m  Port sites c/d/I with staples, no erythema or drainage Staples removed, steri strips placed Small reducible umbilical hernia noted intraoperatively, no ports sites were near the umbilicus, all ports were on the left lateral side    APPENDIX, APPENDECTOMY:       Acute appendicitis with transmural inflammation and serositis.   Patient s/p Robotic assisted appendectomy for acute appendicitis. Doing well.  Steristrips will peel off in the next 5-7 days. You can remove them once they are peeling off. It is ok to shower. Pat the area dry.  Diet and activity as tolerated. You have a small umbilical hernia (belly button hernia). This is just something to be aware of. You only need to get this fixed if it is causing you issues/ pain.  If you ever feel a hard knot that will not push back in that is a reason to go to the hospital to get it "reduced" or pushed back in.   Algis Greenhouse, MD [facility] [address] [place], [place] [address] [phone] (office)

## 2023-07-27 NOTE — Patient Instructions (Addendum)
 Steristrips will peel off in the next 5-7 days. You can remove them once they are peeling off. It is ok to shower. Pat the area dry.  Diet and activity as tolerated. You have a small umbilical hernia (belly button hernia). This is just something to be aware of. You only need to get this fixed if it is causing you issues/ pain.  If you ever feel a hard knot that will not push back in that is a reason to go to the hospital to get it "reduced" or pushed back in.

## 2023-07-29 ENCOUNTER — Encounter: Admitting: General Surgery

## 2023-08-11 ENCOUNTER — Telehealth (INDEPENDENT_AMBULATORY_CARE_PROVIDER_SITE_OTHER): Payer: Self-pay | Admitting: Family Medicine

## 2023-08-11 ENCOUNTER — Encounter: Payer: Self-pay | Admitting: Family Medicine

## 2023-08-11 DIAGNOSIS — I10 Essential (primary) hypertension: Secondary | ICD-10-CM | POA: Diagnosis not present

## 2023-08-11 DIAGNOSIS — R7989 Other specified abnormal findings of blood chemistry: Secondary | ICD-10-CM

## 2023-08-11 NOTE — Progress Notes (Signed)
 MyChart Video Visit Virtual Visit via Video Note   This visit type was conducted w/patient consent. This format is felt to be most appropriate for this patient at this time. Physical exam was limited by quality of the video and audio technology used for the visit. CMA was able to get the patient set up on a video visit.  Patient location: Home. Patient and provider in visit Provider location: Office  I discussed the limitations of evaluation and management by telemedicine and the availability of in person appointments. The patient expressed understanding and agreed to proceed.  Visit Date: 08/11/2023  Today's healthcare provider: Ellsworth Haas, MD     Subjective:    Patient ID: Calvin Simpson, male    DOB: 11-May-1985, 38 y.o.   MRN: [id number]  Chief Complaint  Patient presents with   Hypertension    F/u bp   mood    HPI Discussed the use of AI scribe software for clinical note transcription with the patient, who gave verbal consent to proceed.  History of Present Illness Calvin Simpson is a 38 year old male with hypertension who presents with elevated blood pressure and recurrent headaches.  He has been experiencing recurrent headaches, described as a slight pain at the top of his head. These headaches initially resolved after his appendectomy but have since returned. He began monitoring his blood pressure again after the headaches returned, noting elevated readings such as 133/98, 131/101, 141/103, and 132/94 over the past week.  He is currently taking amlodipine 5 mg daily for hypertension. Previously, he was on valsartan HCT 320/25 mg, which was discontinued. He has not taken Cialis recently and has stopped using Goody's powders due to safety concerns. He occasionally uses ibuprofen and has tadalafil available but has not used it recently.  He underwent an appendectomy for a perforated appendix, which was noted to have dead tissue that was regrowing. Post-surgery, he  experienced a significant improvement in his overall well-being, with increased energy and improved complexion. He was able to enjoy his anniversary weekend at the beach, although he experienced some soreness.  He is concerned about potential swelling if his amlodipine dose is increased, as he has not experienced issues with valsartan in the past.  .   Past Medical History:  Diagnosis Date   Allergy 1994   Amoxicillin   Closed right fibular fracture 09/05/2021   Fx clavicle    Fx wrist    right    GERD (gastroesophageal reflux disease)     Past Surgical History:  Procedure Laterality Date   FRACTURE SURGERY  May 2023   Right tibia and fibula   TIBIA IM NAIL INSERTION Right 09/04/2021   Procedure: INTRAMEDULLARY (IM) NAIL TIBIAL;  Surgeon: Hardy Lia, MD;  Location: [facility];  Service: Orthopedics;  Laterality: Right;   TONSILLECTOMY     XI ROBOTIC LAPAROSCOPIC ASSISTED APPENDECTOMY N/A 07/18/2023   Procedure: APPENDECTOMY, ROBOT-ASSISTED, LAPAROSCOPIC;  Surgeon: Awilda Bogus, MD;  Location: [facility]S;  Service: General;  Laterality: N/A;    Outpatient Medications Prior to Visit  Medication Sig Dispense Refill   acetaminophen (TYLENOL) 500 MG tablet Take 1,000 mg by mouth every 6 (six) hours as needed for mild pain (pain score 1-3).     amLODipine (NORVASC) 5 MG tablet Take 1 tablet (5 mg total) by mouth daily. 90 tablet 0   ibuprofen (ADVIL) 600 MG tablet Take 1 tablet (600 mg total) by mouth every 6 (six) hours as needed. (Patient taking differently:  Take 600 mg by mouth every 6 (six) hours as needed for mild pain (pain score 1-3).) 60 tablet 1   Omeprazole Magnesium (PRILOSEC OTC PO) Take 20 mg by mouth daily.     tadalafil (CIALIS) 20 MG tablet Take 0.5-1 tablets (10-20 mg total) by mouth daily as needed for erectile dysfunction. (Patient not taking: Reported on 07/18/2023) 10 tablet 3   valsartan-hydrochlorothiazide (DIOVAN-HCT) 320-25 MG tablet Take 1 tablet by mouth daily.  90 tablet 0   Aspirin-Acetaminophen (GOODYS BODY PAIN PO) Take 2-3 Packages by mouth 4 (four) times daily as needed (pain).     cyclobenzaprine (FLEXERIL) 5 MG tablet Take 1 tablet (5 mg total) by mouth 3 (three) times daily as needed for muscle spasms. 30 tablet 0   docusate sodium (COLACE) 100 MG capsule Take 1 capsule (100 mg total) by mouth 2 (two) times daily as needed for mild constipation. 30 capsule 0   No facility-administered medications prior to visit.    Allergies  Allergen Reactions   Amoxicillin Hives        Objective:     Physical Exam  Vitals and nursing note reviewed.  Constitutional:      General:  is not in acute distress.    Appearance: Normal appearance.  HENT:     Head: Normocephalic.  Pulmonary:     Effort: No respiratory distress.  Skin:    General: Skin is dry.     Coloration: Skin is not pale.  Neurological:     Mental Status: Pt is alert and oriented to person, place, and time.  Psychiatric:        Mood and Affect: Mood normal.   There were no vitals taken for this visit.  Wt Readings from Last 3 Encounters:  07/27/23 196 lb (88.9 kg)  07/18/23 205 lb 0.4 oz (93 kg)  07/14/23 205 lb 2 oz (93 kg)       Assessment & Plan:   Problem List Items Addressed This Visit     Primary hypertension - Primary   Other Visit Diagnoses       Elevated liver function tests       Relevant Orders   Hepatic function panel   Hemoglobin A1c   Hepatitis C Antibody     Assessment and Plan Assessment & Plan Hypertension   His blood pressure has recently increased,  After appy, it was low so the valsartan hct was d/c'd, and just on amlodipine 5mg .  Current readings range from 133/98 to 141/103 mmHg, with associated headaches indicating inadequate control. He was previously on valsartan HCT, which was stopped post-hospitalization, and is currently taking amlodipine 5 mg only. After discussing options, he will reintroduce valsartan HCT 160/12.5 mg due to  previous tolerance and concerns about potential swelling with increased amlodipine. Continue amlodipine 5 mg daily and reintroduce valsartan HCT 160/12.5 mg daily. He should monitor blood pressure daily and report weekly via MyChart. Consider cutting valsartan HCT 320/25 mg in half if needed for dose adjustment. Schedule follow-up in 3 months, with interim communication via MyChart for medication adjustments.  Appendiceal Perforation   He recently underwent surgical intervention for an appendiceal perforation and reports significant improvement in well-being and energy levels post-surgery. No current complications are reported, although tissue necrosis and regrowth indicate a prolonged issue prior to surgery. Monitor for post-surgical complications or symptoms.    No orders of the defined types were placed in this encounter.   I discussed the assessment and treatment plan with the patient.  The patient was provided an opportunity to ask questions and all were answered. The patient agreed with the plan and demonstrated an understanding of the instructions.   The patient was advised to call back or seek an in-person evaluation if the symptoms worsen or if the condition fails to improve as anticipated.  Return for HTN.  Ellsworth Haas, MD [facility] [phone] (phone) [phone] (fax)  [facility]

## 2023-08-11 NOTE — Patient Instructions (Signed)
 Amlodipine 5mg  and valsartan hct 160/12.5.  monitor bp daily and send weekly

## 2023-08-27 ENCOUNTER — Encounter: Payer: Self-pay | Admitting: Family Medicine

## 2023-10-05 DIAGNOSIS — Z3141 Encounter for fertility testing: Secondary | ICD-10-CM | POA: Diagnosis not present

## 2023-10-26 DIAGNOSIS — H40013 Open angle with borderline findings, low risk, bilateral: Secondary | ICD-10-CM | POA: Diagnosis not present

## 2023-10-28 ENCOUNTER — Other Ambulatory Visit: Payer: Self-pay | Admitting: Family Medicine

## 2023-10-28 ENCOUNTER — Telehealth: Payer: Self-pay | Admitting: Family Medicine

## 2023-10-28 ENCOUNTER — Encounter: Payer: Self-pay | Admitting: Family Medicine

## 2023-10-28 DIAGNOSIS — I10 Essential (primary) hypertension: Secondary | ICD-10-CM

## 2023-10-28 NOTE — Telephone Encounter (Signed)
 Copied from CRM [id number]. Topic: Clinical - Medication Refill >> Oct 28, 2023  4:18 PM Ivette P wrote: Medication: amLODipine  (NORVASC ) 5 MG tablet  Has the patient contacted their pharmacy? Yes (Agent: If no, request that the patient contact the pharmacy for the refill. If patient does not wish to contact the pharmacy document the reason why and proceed with request.) (Agent: If yes, when and what did the pharmacy advise?)  This is the patient's preferred pharmacy:   [id number] - [place], [place] - [address] AT [address] OF [facility] [address] [place] [address] Phone: [phone] Fax: [phone]   Is this the correct pharmacy for this prescription? Yes If no, delete pharmacy and type the correct one.   Has the prescription been filled recently? No  Is the patient out of the medication? Yes, pt has 1 pill left   Has the patient been seen for an appointment in the last year OR does the patient have an upcoming appointment? Yes, 11/09/2023  Can we respond through MyChart? Yes  Agent: Please be advised that Rx refills may take up to 3 business days. We ask that you follow-up with your pharmacy.

## 2023-11-09 ENCOUNTER — Encounter: Payer: Self-pay | Admitting: Family Medicine

## 2023-11-09 ENCOUNTER — Ambulatory Visit: Admitting: Family Medicine

## 2023-11-09 ENCOUNTER — Ambulatory Visit: Attending: Family Medicine

## 2023-11-09 VITALS — BP 110/78 | HR 71 | Temp 98.1°F | Resp 18 | Ht 72.0 in | Wt 200.1 lb

## 2023-11-09 DIAGNOSIS — I10 Essential (primary) hypertension: Secondary | ICD-10-CM

## 2023-11-09 DIAGNOSIS — R0609 Other forms of dyspnea: Secondary | ICD-10-CM | POA: Diagnosis not present

## 2023-11-09 DIAGNOSIS — R002 Palpitations: Secondary | ICD-10-CM

## 2023-11-09 LAB — COMPREHENSIVE METABOLIC PANEL WITH GFR
ALT: 56 U/L — ABNORMAL HIGH (ref 0–53)
AST: 29 U/L (ref 0–37)
Albumin: 4.5 g/dL (ref 3.5–5.2)
Alkaline Phosphatase: 52 U/L (ref 39–117)
BUN: 15 mg/dL (ref 6–23)
CO2: 30 meq/L (ref 19–32)
Calcium: 9.7 mg/dL (ref 8.4–10.5)
Chloride: 97 meq/L (ref 96–112)
Creatinine, Ser: 1.32 mg/dL (ref 0.40–1.50)
GFR: 69.86 mL/min (ref >60.00)
Glucose, Bld: 101 mg/dL — ABNORMAL HIGH (ref 70–99)
Potassium: 3.9 meq/L (ref 3.5–5.1)
Sodium: 134 meq/L — ABNORMAL LOW (ref 135–145)
Total Bilirubin: 0.4 mg/dL (ref 0.2–1.2)
Total Protein: 7.6 g/dL (ref 6.0–8.3)

## 2023-11-09 LAB — CBC WITH DIFFERENTIAL/PLATELET
Basophils Absolute: 0.1 K/uL (ref 0.0–0.1)
Basophils Relative: 1 % (ref 0.0–3.0)
Eosinophils Absolute: 0.1 K/uL (ref 0.0–0.7)
Eosinophils Relative: 1.6 % (ref 0.0–5.0)
HCT: 47.4 % (ref 39.0–52.0)
Hemoglobin: 16.2 g/dL (ref 13.0–17.0)
Lymphocytes Relative: 28.5 % (ref 12.0–46.0)
Lymphs Abs: 1.7 K/uL (ref 0.7–4.0)
MCHC: 34.2 g/dL (ref 30.0–36.0)
MCV: 94.5 fl (ref 78.0–100.0)
Monocytes Absolute: 0.7 K/uL (ref 0.1–1.0)
Monocytes Relative: 12.6 % — ABNORMAL HIGH (ref 3.0–12.0)
Neutro Abs: 3.3 K/uL (ref 1.4–7.7)
Neutrophils Relative %: 56.3 % (ref 43.0–77.0)
Platelets: 294 K/uL (ref 150.0–400.0)
RBC: 5.02 Mil/uL (ref 4.22–5.81)
RDW: 13.5 % (ref 11.5–15.5)
WBC: 5.9 K/uL (ref 4.0–10.5)

## 2023-11-09 LAB — LIPID PANEL
Cholesterol: 277 mg/dL — ABNORMAL HIGH (ref 0–200)
HDL: 37.2 mg/dL — ABNORMAL LOW (ref >39.00)
LDL Cholesterol: 195 mg/dL — ABNORMAL HIGH (ref 0–99)
NonHDL: 239.51
Total CHOL/HDL Ratio: 7
Triglycerides: 221 mg/dL — ABNORMAL HIGH (ref 0.0–149.0)
VLDL: 44.2 mg/dL — ABNORMAL HIGH (ref 0.0–40.0)

## 2023-11-09 LAB — TSH: TSH: 2.71 u[IU]/mL (ref 0.35–5.50)

## 2023-11-09 MED ORDER — AMLODIPINE BESYLATE 5 MG PO TABS: 5.0000 mg | ORAL_TABLET | Freq: Every day | ORAL | 0 refills | Status: DC

## 2023-11-09 MED ORDER — VALSARTAN-HYDROCHLOROTHIAZIDE 320-25 MG PO TABS: 1.0000 | ORAL_TABLET | Freq: Every day | ORAL | 1 refills | Status: DC

## 2023-11-09 NOTE — Progress Notes (Unsigned)
 EP to read.

## 2023-11-09 NOTE — Patient Instructions (Signed)
 It was very nice to see you today!  Zio, echo and cardiology  Check cuff   PLEASE NOTE:  If you had any lab tests please let us  know if you have not heard back within a few days. You may see your results on MyChart before we have a chance to review them but we will give you a call once they are reviewed by us . If we ordered any referrals today, please let us  know if you have not heard from their office within the next week.   Please try these tips to maintain a healthy lifestyle:  Eat most of your calories during the day when you are active. Eliminate processed foods including packaged sweets (pies, cakes, cookies), reduce intake of potatoes, white bread, white pasta, and white rice. Look for whole grain options, oat flour or almond flour.  Each meal should contain half fruits/vegetables, one quarter protein, and one quarter carbs (no bigger than a computer mouse).  Cut down on sweet beverages. This includes juice, soda, and sweet tea. Also watch fruit intake, though this is a healthier sweet option, it still contains natural sugar! Limit to 3 servings daily.  Drink at least 1 glass of water  with each meal and aim for at least 8 glasses per day  Exercise at least 150 minutes every week.

## 2023-11-09 NOTE — Progress Notes (Signed)
 Subjective:     Patient ID: Calvin Simpson, male    DOB: [birth year], 38 y.o.   MRN: [id number]  Chief Complaint  Patient presents with   Hypertension    Follow-up on htn     HPI Discussed the use of AI scribe software for clinical note transcription with the patient, who gave verbal consent to proceed.  History of Present Illness Calvin Simpson is a 38 year old male with hypertension who presents for follow-up on blood pressure management. He is accompanied by his wife.  He is currently on amlodipine  5 mg and valsartan  HCT 320/25 mg for hypertension. His home blood pressure readings are typically in the upper 120s over mid-90s, with occasional readings in the 130s over 80s. Headaches have resolved, but he is concerned about an elevated pulse, recorded as high as 115 bpm.  He experiences episodes of feeling winded quickly, particularly during physical activities such as playing catch with his son or working in large facilities. He needs to pause and rest for about five minutes before resuming activities. No chest pain during these episodes. His pulse remains elevated even at rest, with readings in the 80s and 90s. This has been noted since his blood pressure issues began, with home records from May and June showing similar pulse rates.  He snores consistently, but neither he nor his wife have noticed any periods of stopped breathing during sleep. No periods of stopped breathing during sleep have been experienced.  He has not had an EKG since 2023 and recalls no cardiac evaluations during his recent hospitalization for appendicitis.  His erections are functioning well, with no significant issues noted.    Health Maintenance Due  Topic Date Due   Hepatitis C Screening  Never done   Hepatitis B Vaccines (1 of 3 - 19+ 3-dose series) Never done   HPV VACCINES (1 - 3-dose SCDM series) Never done    Past Medical History:  Diagnosis Date   Allergy 1994   Amoxicillin   Closed right  fibular fracture 09/05/2021   Fx clavicle    Fx wrist    right    GERD (gastroesophageal reflux disease)     Past Surgical History:  Procedure Laterality Date   FRACTURE SURGERY  May 2023   Right tibia and fibula   TIBIA IM NAIL INSERTION Right 09/04/2021   Procedure: INTRAMEDULLARY (IM) NAIL TIBIAL;  Surgeon: Celena Sharper, MD;  Location: [facility] OR;  Service: Orthopedics;  Laterality: Right;   TONSILLECTOMY     XI ROBOTIC LAPAROSCOPIC ASSISTED APPENDECTOMY N/A 07/18/2023   Procedure: APPENDECTOMY, ROBOT-ASSISTED, LAPAROSCOPIC;  Surgeon: Kallie Manuelita BROCKS, MD;  Location: [facility];  Service: General;  Laterality: N/A;     Current Outpatient Medications:    acetaminophen  (TYLENOL ) 500 MG tablet, Take 1,000 mg by mouth every 6 (six) hours as needed for mild pain (pain score 1-3)., Disp: , Rfl:    ibuprofen  (ADVIL ) 600 MG tablet, Take 1 tablet (600 mg total) by mouth every 6 (six) hours as needed. (Patient taking differently: Take 600 mg by mouth every 6 (six) hours as needed for mild pain (pain score 1-3).), Disp: 60 tablet, Rfl: 1   Omeprazole  Magnesium  (PRILOSEC  OTC PO), Take 20 mg by mouth daily., Disp: , Rfl:    tadalafil  (CIALIS ) 20 MG tablet, Take 0.5-1 tablets (10-20 mg total) by mouth daily as needed for erectile dysfunction., Disp: 10 tablet, Rfl: 3   amLODipine  (NORVASC ) 5 MG tablet, Take 1 tablet (5 mg total)  by mouth daily., Disp: 90 tablet, Rfl: 0   valsartan -hydrochlorothiazide  (DIOVAN -HCT) 320-25 MG tablet, Take 1 tablet by mouth daily., Disp: 90 tablet, Rfl: 1  Allergies  Allergen Reactions   Amoxicillin Hives   ROS neg/noncontributory except as noted HPI/below      Objective:     BP 110/78 (BP Location: Left Arm, Patient Position: Sitting, Cuff Size: Normal)   Pulse 71   Temp 98.1 F (36.7 C) (Temporal)   Resp 18   Ht 6' (1.829 m)   Wt 200 lb 2 oz (90.8 kg)   SpO2 99%   BMI 27.14 kg/m  Wt Readings from Last 3 Encounters:  11/09/23 200 lb 2 oz (90.8 kg)   07/27/23 196 lb (88.9 kg)  07/18/23 205 lb 0.4 oz (93 kg)    Physical Exam   Gen: WDWN NAD HEENT: NCAT, conjunctiva not injected, sclera nonicteric NECK:  supple, no thyromegaly, no nodes, no carotid bruits CARDIAC: RRR, S1S2+, no murmur. DP 2+B LUNGS: CTAB. No wheezes ABDOMEN:  BS+, soft, NTND, No HSM, no masses EXT:  no edema MSK: no gross abnormalities.  NEURO: A&O x3.  CN II-XII intact.  PSYCH: normal mood. Good eye contact  EKG nsr.  No st changes     Assessment & Plan:  Primary hypertension -     EKG 12-Lead -     LONG TERM MONITOR (3-14 DAYS); Future -     Ambulatory referral to Cardiology -     ECHOCARDIOGRAM COMPLETE; Future -     CBC with Differential/Platelet -     Comprehensive metabolic panel with GFR -     TSH -     Lipid panel -     Valsartan -hydroCHLOROthiazide ; Take 1 tablet by mouth daily.  Dispense: 90 tablet; Refill: 1 -     amLODIPine  Besylate; Take 1 tablet (5 mg total) by mouth daily.  Dispense: 90 tablet; Refill: 0  Palpitation -     EKG 12-Lead -     LONG TERM MONITOR (3-14 DAYS); Future -     Ambulatory referral to Cardiology -     ECHOCARDIOGRAM COMPLETE; Future -     TSH  DOE (dyspnea on exertion) -     EKG 12-Lead -     LONG TERM MONITOR (3-14 DAYS); Future -     Ambulatory referral to Cardiology -     ECHOCARDIOGRAM COMPLETE; Future  Assessment and Plan Assessment & Plan Tachycardia and Dyspnea on Exertion   He experiences episodes of tachycardia and dyspnea during physical activity, with heart rates ranging from the 80s to 115s, occasionally higher, but no chest pain. There is concern for arrhythmias such as atrial fibrillation or supraventricular tachycardia. Diagnostic tests will rule out arrhythmias and structural heart issues. A cardiologist consultation is planned for further evaluation and management. Order an EKG and a 2-week heart event monitor. Schedule an echocardiogram to assess for structural heart issues and refer to  cardiology for further evaluation.  Hypertension   His hypertension is managed with amlodipine  5 mg and valsartan  320/25 mg. Readings here are normal. Home blood pressure readings are in the upper 120s/90s, which is higher than desired, though headaches have resolved. There is concern for potential end-organ damage due to elevated blood pressure. Continue amlodipine  5 mg daily and valsartan  320/25 mg daily. Recheck blood pressure with a reliable cuff and consider checking blood pressure at a pharmacy for accuracy.  General Health Maintenance   Lifestyle changes to reduce stress were discussed, which may  benefit cardiovascular health and blood pressure management. Encourage stress reduction techniques and consider a job change to reduce stress.  Follow-up   Follow-up plans are in place to ensure timely evaluation and management of symptoms. Heart monitor results will be reviewed to determine further action. A cardiologist appointment is scheduled to coincide with the completion of diagnostic tests. Schedule a cardiology appointment, preferably in [place]. Follow up with results of the heart monitor and echocardiogram. Ensure medication refills are up to date.    Return in about 3 months (around 02/09/2024) for HTN.  Jenkins CHRISTELLA Carrel, MD

## 2023-11-10 ENCOUNTER — Ambulatory Visit: Payer: Self-pay | Admitting: Family Medicine

## 2023-11-10 ENCOUNTER — Other Ambulatory Visit: Payer: Self-pay | Admitting: *Deleted

## 2023-11-10 MED ORDER — ROSUVASTATIN CALCIUM 20 MG PO TABS: 20.0000 mg | ORAL_TABLET | Freq: Every day | ORAL | 0 refills | Status: DC

## 2023-11-10 NOTE — Progress Notes (Signed)
 Sodium slightly low-prob from the hydrochlorothiazide  in meds.  If symptoms of confusion/dizziness occur, let me know.  O/w not concerned Liver test slightly elevated.  Work on [employer].  Don't drink a lot of alcohol.  CT-liver ok, so just monitor next appt Cholesterol very high-should start meds-crestor  20mg  daily #90/0.  Will reck in oct

## 2023-11-28 DIAGNOSIS — R0609 Other forms of dyspnea: Secondary | ICD-10-CM | POA: Diagnosis not present

## 2023-11-28 DIAGNOSIS — R002 Palpitations: Secondary | ICD-10-CM

## 2023-11-28 DIAGNOSIS — I10 Essential (primary) hypertension: Secondary | ICD-10-CM

## 2023-11-29 NOTE — Progress Notes (Signed)
 Monitor ok.  I don't see that Cardiology has scheduled appt to see them.  Please have him call them [phone]

## 2023-12-28 ENCOUNTER — Ambulatory Visit: Admitting: Internal Medicine

## 2024-01-04 ENCOUNTER — Ambulatory Visit (HOSPITAL_COMMUNITY)

## 2024-02-07 ENCOUNTER — Other Ambulatory Visit: Payer: Self-pay | Admitting: Family Medicine

## 2024-02-14 ENCOUNTER — Ambulatory Visit: Admitting: Family Medicine

## 2024-02-22 ENCOUNTER — Encounter: Payer: Self-pay | Admitting: Family Medicine

## 2024-02-22 ENCOUNTER — Ambulatory Visit: Payer: Self-pay | Admitting: Family Medicine

## 2024-02-22 VITALS — BP 122/74 | HR 90 | Temp 98.3°F | Ht 72.0 in | Wt 205.2 lb

## 2024-02-22 DIAGNOSIS — I10 Essential (primary) hypertension: Secondary | ICD-10-CM

## 2024-02-22 DIAGNOSIS — N528 Other male erectile dysfunction: Secondary | ICD-10-CM | POA: Diagnosis not present

## 2024-02-22 DIAGNOSIS — E7800 Pure hypercholesterolemia, unspecified: Secondary | ICD-10-CM

## 2024-02-22 MED ORDER — ROSUVASTATIN CALCIUM 20 MG PO TABS: 20.0000 mg | ORAL_TABLET | Freq: Every day | ORAL | 1 refills | Status: DC

## 2024-02-22 MED ORDER — TADALAFIL 20 MG PO TABS: 10.0000 mg | ORAL_TABLET | ORAL | 5 refills | Status: AC | PRN

## 2024-02-22 MED ORDER — TADALAFIL 20 MG PO TABS: 10.0000 mg | ORAL_TABLET | Freq: Every day | ORAL | 3 refills | Status: AC | PRN

## 2024-02-22 MED ORDER — AMLODIPINE BESYLATE 5 MG PO TABS: 5.0000 mg | ORAL_TABLET | Freq: Every day | ORAL | 1 refills | Status: DC

## 2024-02-22 NOTE — Progress Notes (Signed)
 Subjective:     Patient ID: Calvin Simpson, male    DOB: 1985/11/19, 38 y.o.   MRN: [id number]  Chief Complaint  Patient presents with   Hypertension    Pt is here for followup for hypertension    Discussed the use of AI scribe software for clinical note transcription with the patient, who gave verbal consent to proceed.  History of Present Illness Calvin Simpson is a 38 year old male with hypertension who presents for follow-up. He is accompanied by his wife.  He takes valsartan  HCT 320/25 mg, amlodipine  5mg  for hypertension. He monitors his blood pressure at home approximately twice a month, with readings typically in the 130s/90s range. No headaches, dizziness, chest pain, or swelling in the legs.  He uses tadalafil  for erectile dysfunction, finding the whole pill more effective than half. His insurance covers six pills per month, but he requires more and is willing to pay out of pocket for additional pills. No chest pain or shortness of breath during sexual activity.  He has not seen a cardiologist or undergone an echocardiogram due to scheduling conflicts and a recent job change.  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 He does not consume vegetables regularly, and his LDL was previously recorded at 195 mg/dL-taking rosuvastatin  20mg  daily. There is uncertainty about a family history of heart disease.    Health Maintenance Due  Topic Date Due   Hepatitis C Screening  Never done   Hepatitis B Vaccines 19-59 Average Risk (1 of 3 - 19+ 3-dose series) Never done    Past Medical History:  Diagnosis Date   Allergy 1994   Amoxicillin   Closed right fibular fracture 09/05/2021   Fx clavicle    Fx wrist    right    GERD (gastroesophageal reflux disease)     Past Surgical History:  Procedure Laterality Date   FRACTURE SURGERY  May 2023   Right tibia and fibula   TIBIA IM NAIL  INSERTION Right 09/04/2021   Procedure: INTRAMEDULLARY (IM) NAIL TIBIAL;  Surgeon: Celena Sharper, MD;  Location: [facility] OR;  Service: Orthopedics;  Laterality: Right;   TONSILLECTOMY     XI ROBOTIC LAPAROSCOPIC ASSISTED APPENDECTOMY N/A 07/18/2023   Procedure: APPENDECTOMY, ROBOT-ASSISTED, LAPAROSCOPIC;  Surgeon: Kallie Manuelita BROCKS, MD;  Location: [facility];  Service: General;  Laterality: N/A;     Current Outpatient Medications:    Omeprazole  Magnesium  (PRILOSEC  OTC PO), Take 20 mg by mouth daily., Disp: , Rfl:    tadalafil  (CIALIS ) 20 MG tablet, Take 0.5-1 tablets (10-20 mg total) by mouth every other day as needed for erectile dysfunction., Disp: 30 tablet, Rfl: 5   valsartan -hydrochlorothiazide  (DIOVAN -HCT) 320-25 MG tablet, Take 1 tablet by mouth daily., Disp: 90 tablet, Rfl: 1   amLODipine  (NORVASC ) 5 MG tablet, Take 1 tablet (5 mg total) by mouth daily., Disp: 90 tablet, Rfl: 1   rosuvastatin  (CRESTOR ) 20 MG tablet, Take 1 tablet (20 mg total) by mouth daily., Disp: 90 tablet, Rfl: 1   tadalafil  (CIALIS ) 20 MG tablet, Take 0.5-1 tablets (10-20 mg total) by mouth daily as needed for erectile dysfunction., Disp: 10 tablet, Rfl: 3  Allergies  Allergen Reactions   Amoxicillin Hives   ROS neg/noncontributory except as noted HPI/below      Objective:     BP 122/74 (BP Location: Left Arm, Patient Position: Sitting, Cuff Size: Large)   Pulse  90   Temp 98.3 F (36.8 C) (Temporal)   Ht 6' (1.829 m)   Wt 205 lb 4 oz (93.1 kg)   SpO2 99%   BMI 27.84 kg/m  Wt Readings from Last 3 Encounters:  02/22/24 205 lb 4 oz (93.1 kg)  11/09/23 200 lb 2 oz (90.8 kg)  07/27/23 196 lb (88.9 kg)    Physical Exam VITALS: BP- 120/70 GENERAL: Well developed, well nourished, no acute distress. HEAD EYES EARS NOSE THROAT: Normocephalic, atraumatic, conjunctiva not injected, sclera nonicteric. CARDIAC: Regular rate and rhythm, S1 S2 present, no murmur, dorsalis pedis 2 plus bilaterally. NECK: Supple, no  thyromegaly, no lymphadenopathy, no carotid bruits. LUNGS: Clear to auscultation bilaterally, no wheezes. ABDOMEN: Bowel sounds present, soft, non-tender, non-distended, no hepatosplenomegaly, no masses. EXTREMITIES: No edema or swelling. MUSCULOSKELETAL: No gross abnormalities. NEUROLOGICAL: Alert and oriented x3, cranial nerves II through XII intact. PSYCHIATRIC: Normal mood, good eye contact.       Assessment & Plan:  Primary hypertension -     amLODIPine  Besylate; Take 1 tablet (5 mg total) by mouth daily.  Dispense: 90 tablet; Refill: 1  Pure hypercholesterolemia -     Comprehensive metabolic panel with GFR -     Lipid panel -     Hemoglobin A1c -     Rosuvastatin  Calcium ; Take 1 tablet (20 mg total) by mouth daily.  Dispense: 90 tablet; Refill: 1  Other male erectile dysfunction -     Tadalafil ; Take 0.5-1 tablets (10-20 mg total) by mouth daily as needed for erectile dysfunction.  Dispense: 10 tablet; Refill: 3  Other orders -     Tadalafil ; Take 0.5-1 tablets (10-20 mg total) by mouth every other day as needed for erectile dysfunction.  Dispense: 30 tablet; Refill: 5    Assessment and Plan Assessment & Plan Essential hypertension   Blood pressure readings are generally in the 130s/90s range, with occasional lower readings. He reports no symptoms such as headache, dizziness, chest pain, or leg swelling. He did not follow up with the cardiologist or complete the recommended echocardiogram. Continue valsartan  HCT 320/25 mg and amlodipine  5mg . Monitor blood pressure at least once a month, more frequently if readings are abnormal. Encourage follow-up with the cardiologist for further evaluation.  Pure hypercholesterolemia   He started rosuvastatin  in July, but no blood work has been done since. Previous LDL was 195, suggesting possible familial hypercholesterolemia. Discussed diet and exercise habits, emphasizing potential genetic factors. Order blood work to assess cholesterol  levels. Continue rosuvastatin  20mg . Encourage dietary modifications and regular exercise.  Erectile dysfunction   He uses tadalafil  with insurance covering six pills per month but prefers as-needed use and requires more than six pills monthly. Discussed cost-saving options with [facility] and potential side effects. Prescribe tadalafil  with insurance coverage for six pills per month and provide a prescription for additional tadalafil  to be filled using [facility] for cost savings. Discussed potential side effects and advised monitoring for chest pain or shortness of breath during use.     Return in about 6 months (around 08/22/2024) for HTN, cholesterol.  Jenkins CHRISTELLA Carrel, MD

## 2024-02-22 NOTE — Patient Instructions (Signed)

## 2024-02-23 LAB — COMPREHENSIVE METABOLIC PANEL WITH GFR
ALT: 80 U/L — ABNORMAL HIGH (ref 0–53)
AST: 43 U/L — ABNORMAL HIGH (ref 0–37)
Albumin: 4.8 g/dL (ref 3.5–5.2)
Alkaline Phosphatase: 51 U/L (ref 39–117)
BUN: 13 mg/dL (ref 6–23)
CO2: 27 meq/L (ref 19–32)
Calcium: 9.7 mg/dL (ref 8.4–10.5)
Chloride: 100 meq/L (ref 96–112)
Creatinine, Ser: 1.24 mg/dL (ref 0.40–1.50)
GFR: 75.14 mL/min (ref >60.00)
Glucose, Bld: 101 mg/dL — ABNORMAL HIGH (ref 70–99)
Potassium: 3.5 meq/L (ref 3.5–5.1)
Sodium: 137 meq/L (ref 135–145)
Total Bilirubin: 0.5 mg/dL (ref 0.2–1.2)
Total Protein: 7.7 g/dL (ref 6.0–8.3)

## 2024-02-23 LAB — LIPID PANEL
Cholesterol: 131 mg/dL (ref 0–200)
HDL: 36.6 mg/dL — ABNORMAL LOW (ref >39.00)
LDL Cholesterol: 47 mg/dL (ref 0–99)
NonHDL: 94.7
Total CHOL/HDL Ratio: 4
Triglycerides: 239 mg/dL — ABNORMAL HIGH (ref 0.0–149.0)
VLDL: 47.8 mg/dL — ABNORMAL HIGH (ref 0.0–40.0)

## 2024-02-23 LAB — HEMOGLOBIN A1C: Hgb A1c MFr Bld: 5.7 % (ref 4.6–6.5)

## 2024-02-24 ENCOUNTER — Ambulatory Visit: Payer: Self-pay | Admitting: Family Medicine

## 2024-02-24 DIAGNOSIS — R7989 Other specified abnormal findings of blood chemistry: Secondary | ICD-10-CM

## 2024-02-24 NOTE — Progress Notes (Signed)
 Cholesterol way better.  Can cut the rosuvastatin  in 1/2 or take every other day.  Sugar and A1C slightly elevated-work on diet/exercise  Liver test slightly worse-work on diet/exercise and decreasing the cholesterol med as above and make sure not drinking a lot of alcohol.  Repeat cmp in 2 months.

## 2024-02-25 NOTE — Telephone Encounter (Signed)
 Called pt notified of labs and scheduled follow up blood work per Dr Wendolyn

## 2024-04-24 ENCOUNTER — Other Ambulatory Visit

## 2024-04-24 DIAGNOSIS — R7989 Other specified abnormal findings of blood chemistry: Secondary | ICD-10-CM

## 2024-04-25 ENCOUNTER — Ambulatory Visit: Payer: Self-pay | Admitting: Family Medicine

## 2024-04-25 LAB — COMPREHENSIVE METABOLIC PANEL WITH GFR
ALT: 47 U/L (ref 3–53)
AST: 31 U/L (ref 5–37)
Albumin: 4.7 g/dL (ref 3.5–5.2)
Alkaline Phosphatase: 49 U/L (ref 39–117)
BUN: 13 mg/dL (ref 6–23)
CO2: 31 meq/L (ref 19–32)
Calcium: 9.7 mg/dL (ref 8.4–10.5)
Chloride: 96 meq/L (ref 96–112)
Creatinine, Ser: 1.14 mg/dL (ref 0.40–1.50)
GFR: 83.02 mL/min
Glucose, Bld: 102 mg/dL — ABNORMAL HIGH (ref 70–99)
Potassium: 3.6 meq/L (ref 3.5–5.1)
Sodium: 136 meq/L (ref 135–145)
Total Bilirubin: 0.5 mg/dL (ref 0.2–1.2)
Total Protein: 8.1 g/dL (ref 6.0–8.3)

## 2024-04-25 LAB — HEPATIC FUNCTION PANEL
ALT: 47 U/L (ref 3–53)
AST: 31 U/L (ref 5–37)
Albumin: 4.7 g/dL (ref 3.5–5.2)
Alkaline Phosphatase: 49 U/L (ref 39–117)
Bilirubin, Direct: 0.1 mg/dL (ref 0.1–0.3)
Total Bilirubin: 0.5 mg/dL (ref 0.2–1.2)
Total Protein: 8.1 g/dL (ref 6.0–8.3)

## 2024-04-25 LAB — HEPATITIS C ANTIBODY: Hepatitis C Ab: NONREACTIVE

## 2024-04-25 LAB — HEMOGLOBIN A1C: Hgb A1c MFr Bld: 5.9 % (ref 4.6–6.5)

## 2024-04-25 NOTE — Progress Notes (Signed)
Labs great except: A1C(3 month average of sugars) is elevated.  This is considered PreDiabetes.  Work on diet-decrease sugars and starches and aim for 30 minutes of exercise 5 days/week to prevent progression to diabetes

## 2024-04-26 NOTE — Progress Notes (Signed)
 Patient read Lab results notes from [facility] via [place].   Last read by Vinie JINNY Kiss at 7:44PM on 04/25/2024.

## 2024-05-08 ENCOUNTER — Other Ambulatory Visit: Payer: Self-pay

## 2024-05-08 DIAGNOSIS — I10 Essential (primary) hypertension: Secondary | ICD-10-CM

## 2024-05-08 DIAGNOSIS — E7800 Pure hypercholesterolemia, unspecified: Secondary | ICD-10-CM

## 2024-05-08 MED ORDER — ROSUVASTATIN CALCIUM 20 MG PO TABS: 20.0000 mg | ORAL_TABLET | Freq: Every day | ORAL | 1 refills | Status: AC

## 2024-05-08 MED ORDER — VALSARTAN-HYDROCHLOROTHIAZIDE 320-25 MG PO TABS: 1.0000 | ORAL_TABLET | Freq: Every day | ORAL | 1 refills | Status: AC

## 2024-05-08 MED ORDER — AMLODIPINE BESYLATE 5 MG PO TABS: 5.0000 mg | ORAL_TABLET | Freq: Every day | ORAL | 1 refills | Status: AC

## 2024-05-08 NOTE — Progress Notes (Signed)
 Calvin Simpson

## 2024-08-22 ENCOUNTER — Ambulatory Visit: Admitting: Family Medicine
